# Patient Record
Sex: Female | Born: 1975 | Race: Asian | Hispanic: No | Marital: Married | Smoking: Never smoker
Health system: Southern US, Community
[De-identification: ages and names within clinical notes are randomized; demographics above are authoritative.]

## PROBLEM LIST (undated history)

## (undated) ENCOUNTER — Inpatient Hospital Stay (HOSPITAL_COMMUNITY): Payer: Self-pay

## (undated) ENCOUNTER — Emergency Department (HOSPITAL_COMMUNITY): Discharge status: Absent without leave | Payer: Medicaid Other

## (undated) DIAGNOSIS — K8020 Calculus of gallbladder without cholecystitis without obstruction: Secondary | ICD-10-CM

---

## 2008-02-02 ENCOUNTER — Emergency Department (HOSPITAL_COMMUNITY): Admission: EM | Admit: 2008-02-02 | Discharge: 2008-02-02 | Payer: Self-pay | Admitting: Family Medicine

## 2008-04-27 ENCOUNTER — Encounter: Payer: Self-pay | Admitting: Family

## 2008-04-27 ENCOUNTER — Ambulatory Visit: Payer: Self-pay | Admitting: Obstetrics & Gynecology

## 2008-04-27 LAB — CONVERTED CEMR LAB
Antibody Screen: NEGATIVE
Basophils Relative: 0 % (ref 0–1)
Eosinophils Absolute: 0.2 10*3/uL (ref 0.0–0.7)
Hepatitis B Surface Ag: NEGATIVE
Lymphs Abs: 1.8 10*3/uL (ref 0.7–4.0)
MCHC: 33.2 g/dL (ref 30.0–36.0)
Monocytes Relative: 8 % (ref 3–12)
Neutro Abs: 10.1 10*3/uL — ABNORMAL HIGH (ref 1.7–7.7)
Neutrophils Relative %: 77 % (ref 43–77)
Platelets: 286 10*3/uL (ref 150–400)
RBC: 3.36 M/uL — ABNORMAL LOW (ref 3.87–5.11)
Rubella: 57.5 intl units/mL — ABNORMAL HIGH
WBC: 13.2 10*3/uL — ABNORMAL HIGH (ref 4.0–10.5)

## 2008-04-29 ENCOUNTER — Ambulatory Visit (HOSPITAL_COMMUNITY): Admission: RE | Admit: 2008-04-29 | Discharge: 2008-04-29 | Payer: Self-pay | Admitting: Obstetrics and Gynecology

## 2008-05-10 ENCOUNTER — Encounter: Admission: RE | Admit: 2008-05-10 | Discharge: 2008-05-10 | Payer: Self-pay | Admitting: *Deleted

## 2008-05-11 ENCOUNTER — Ambulatory Visit: Payer: Self-pay | Admitting: Obstetrics & Gynecology

## 2008-05-25 ENCOUNTER — Encounter: Payer: Self-pay | Admitting: Family

## 2008-05-25 ENCOUNTER — Ambulatory Visit: Payer: Self-pay | Admitting: Obstetrics & Gynecology

## 2008-05-25 LAB — CONVERTED CEMR LAB
Chlamydia, DNA Probe: NEGATIVE
Clue Cells Wet Prep HPF POC: NONE SEEN
GC Probe Amp, Genital: NEGATIVE
Trich, Wet Prep: NONE SEEN

## 2008-05-26 ENCOUNTER — Ambulatory Visit: Payer: Self-pay | Admitting: Family Medicine

## 2008-05-26 ENCOUNTER — Encounter: Payer: Self-pay | Admitting: Family

## 2008-05-26 LAB — CONVERTED CEMR LAB: Protein, Ur: 149 mg/24hr — ABNORMAL HIGH (ref 50–100)

## 2008-06-01 ENCOUNTER — Ambulatory Visit: Payer: Self-pay | Admitting: Obstetrics & Gynecology

## 2008-06-08 ENCOUNTER — Encounter: Payer: Self-pay | Admitting: Family

## 2008-06-08 ENCOUNTER — Ambulatory Visit: Payer: Self-pay | Admitting: Obstetrics & Gynecology

## 2008-06-15 ENCOUNTER — Ambulatory Visit: Payer: Self-pay | Admitting: Obstetrics & Gynecology

## 2008-06-22 ENCOUNTER — Encounter: Payer: Self-pay | Admitting: Family

## 2008-06-22 ENCOUNTER — Ambulatory Visit: Payer: Self-pay | Admitting: Obstetrics & Gynecology

## 2008-06-28 ENCOUNTER — Inpatient Hospital Stay (HOSPITAL_COMMUNITY): Admission: AD | Admit: 2008-06-28 | Discharge: 2008-07-01 | Payer: Self-pay | Admitting: Obstetrics & Gynecology

## 2008-06-28 ENCOUNTER — Ambulatory Visit: Payer: Self-pay | Admitting: Obstetrics and Gynecology

## 2008-07-07 ENCOUNTER — Ambulatory Visit: Admission: RE | Admit: 2008-07-07 | Discharge: 2008-07-07 | Payer: Self-pay | Admitting: Obstetrics and Gynecology

## 2010-06-27 LAB — RPR: RPR Ser Ql: NONREACTIVE

## 2010-06-27 LAB — CBC
HCT: 27.1 % — ABNORMAL LOW (ref 36.0–46.0)
Hemoglobin: 6.6 g/dL — CL (ref 12.0–15.0)
MCHC: 32.9 g/dL (ref 30.0–36.0)
Platelets: 392 10*3/uL (ref 150–400)
RBC: 2.65 MIL/uL — ABNORMAL LOW (ref 3.87–5.11)
RDW: 15 % (ref 11.5–15.5)
WBC: 15 10*3/uL — ABNORMAL HIGH (ref 4.0–10.5)
WBC: 20.2 10*3/uL — ABNORMAL HIGH (ref 4.0–10.5)

## 2010-06-27 LAB — POCT URINALYSIS DIP (DEVICE)
Ketones, ur: NEGATIVE mg/dL
Nitrite: NEGATIVE
Protein, ur: NEGATIVE mg/dL
Urobilinogen, UA: 0.2 mg/dL (ref 0.0–1.0)
pH: 7 (ref 5.0–8.0)

## 2010-06-28 LAB — POCT URINALYSIS DIP (DEVICE)
Bilirubin Urine: NEGATIVE
Bilirubin Urine: NEGATIVE
Glucose, UA: NEGATIVE mg/dL
Glucose, UA: NEGATIVE mg/dL
Glucose, UA: NEGATIVE mg/dL
Ketones, ur: NEGATIVE mg/dL
Ketones, ur: NEGATIVE mg/dL
Nitrite: NEGATIVE
Protein, ur: NEGATIVE mg/dL
Specific Gravity, Urine: 1.01 (ref 1.005–1.030)
Specific Gravity, Urine: 1.015 (ref 1.005–1.030)
Specific Gravity, Urine: 1.025 (ref 1.005–1.030)
pH: 7 (ref 5.0–8.0)
pH: 7 (ref 5.0–8.0)
pH: 6.5 (ref 5.0–8.0)

## 2010-07-03 LAB — POCT URINALYSIS DIP (DEVICE)
Ketones, ur: NEGATIVE mg/dL
Ketones, ur: 15 mg/dL — AB
Protein, ur: 100 mg/dL — AB
Protein, ur: 100 mg/dL — AB
Specific Gravity, Urine: 1.015 (ref 1.005–1.030)
pH: 6.5 (ref 5.0–8.0)
pH: 7 (ref 5.0–8.0)

## 2010-07-31 NOTE — Op Note (Signed)
Diana Velasquez, Diana Velasquez                   ACCOUNT NO.:  [id number]   MEDICAL RECORD NO.:  [id number]          PATIENT TYPE:  INP   LOCATION:  9143                          FACILITY:  [facility]   PHYSICIAN:  Tilda Burrow, M.D. DATE OF BIRTH:  09-12-1975   DATE OF PROCEDURE:  06/29/2008  DATE OF DISCHARGE:                               OPERATIVE REPORT   Ms. Tappan progressed slowly with her labor.  She was 9-cm dilated at  10:05 and she was 9-10 cm, 100% effaced, -1 at 11:35 a.m. and reached  complete at 1315.  She had a good fetal descent at second stage, but did  not cooperate with contractions.  She refused to push.  We allowed the  baby to labor down.  At 4 o'clock, she was still refusing to push, even  though the epidural had been elapsed for a period of time, then taken  off.  The epidural was restarted at 1600.  Vacuum assistance was  attempted at a +3 station by Dr. Alvester Morin after consent in Language Line and  a support person.  The patient did not assist with the kiwi and 3 pulls  were attempted, but no significant progress made other than some  crowning of vertex.  The vacuum was discontinued.  At this time we  allowed her to continue to rest.  The patient had excellent analgesic at  point in time.  Shortly after 5:30 p.m. the epidural was stopped once  again.  The patient was not feeling her contractions well.  The epidural  was allowed to wear off.  At approximately 1810, the patient through her  support person stated that she felt like she wanted to push.  On her  first pushing effort she expelled the vertex completely and I arrived  immediately as the vertex was expelled.  The nurse and I together guided  the baby in a controlled fashion, delivering his shoulders, from the  left shoulder anterior position.  Delivering the baby without  difficulty.  The cord was clamped, the infant was placed briefly on  maternal abdomen.  The baby then cried spontaneously and vigorously.  Meconium was  suctioned from the nose and pharynx.  No efforts at  intubation were indicated.  The baby was a healthy female infant.  Apgars were 9 and 9.  Placenta delivered intact.  She also had a 3-  vessel cord with cord blood samples obtained.  Only first 3 lacerations  were noted, not requiring repair.  The epidural catheter will not be  removed.  The patient does not have any plans for future contraception  that we can determine at this time.      Tilda Burrow, M.D.  Electronically Signed     JVF/MEDQ  D:  06/29/2008  T:  06/30/2008  Job:  [id number]

## 2010-12-18 LAB — POCT URINALYSIS DIP (DEVICE)
Glucose, UA: NEGATIVE mg/dL
Specific Gravity, Urine: 1.01 (ref 1.005–1.030)
Urobilinogen, UA: 0.2 mg/dL (ref 0.0–1.0)
pH: 6.5 (ref 5.0–8.0)

## 2010-12-18 LAB — GC/CHLAMYDIA PROBE AMP, GENITAL
Chlamydia, DNA Probe: NEGATIVE
GC Probe Amp, Genital: NEGATIVE

## 2010-12-18 LAB — WET PREP, GENITAL

## 2010-12-18 LAB — URINE CULTURE

## 2011-11-11 ENCOUNTER — Inpatient Hospital Stay (HOSPITAL_COMMUNITY)
Admission: AD | Admit: 2011-11-11 | Discharge: 2011-11-12 | Disposition: A | Discharge status: Hospice | Payer: Medicaid Other | Source: Ambulatory Visit | Attending: Obstetrics & Gynecology | Admitting: Obstetrics & Gynecology

## 2011-11-11 ENCOUNTER — Encounter (HOSPITAL_COMMUNITY): Payer: Self-pay | Admitting: *Deleted

## 2011-11-11 DIAGNOSIS — O99891 Other specified diseases and conditions complicating pregnancy: Secondary | ICD-10-CM | POA: Insufficient documentation

## 2011-11-11 DIAGNOSIS — K219 Gastro-esophageal reflux disease without esophagitis: Secondary | ICD-10-CM | POA: Insufficient documentation

## 2011-11-11 DIAGNOSIS — R109 Unspecified abdominal pain: Secondary | ICD-10-CM | POA: Insufficient documentation

## 2011-11-11 MED ORDER — GI COCKTAIL ~~LOC~~
30.0000 mL | Freq: Once | ORAL | Status: AC
  Administered 2011-11-12: 30 mL via ORAL
  Filled 2011-11-11: qty 30

## 2011-11-11 NOTE — MAU Note (Signed)
Arrived via EMS with c/o upper abdominal pain in epigastric area approx 2000.  Says it worsened 2 hrs after she ate.  Denies bleeding or leaking

## 2011-11-12 DIAGNOSIS — K219 Gastro-esophageal reflux disease without esophagitis: Secondary | ICD-10-CM

## 2011-11-12 MED ORDER — ONDANSETRON 4 MG PO TBDP
4.0000 mg | ORAL_TABLET | Freq: Once | ORAL | Status: AC
  Administered 2011-11-12: 4 mg via ORAL
  Filled 2011-11-12: qty 1

## 2011-11-12 NOTE — MAU Provider Note (Signed)
Diana Velasquez is a 36 y.o. female presenting for eval of upper abd pain via EMS. Denies ctx, leak or bldg. Receives prenatal care at [facility] but no records avail for review. Pt's son translated. Reports this is the first instance of this pain and denies any hx of cholestasis. History OB History    Grav Para Term Preterm Abortions TAB SAB Ect Mult Living   5 3 3       3      Past Medical History  Diagnosis Date  . No pertinent past medical history    Past Surgical History  Procedure Date  . No past surgeries    Family History: family history is not on file. Social History:  reports that she has never smoked. Her smokeless tobacco use includes Chew. Her alcohol and drug histories not on file.    ROS  Dilation:  (ext os 1cm int os closed) Effacement (%): Thick Exam by:: Elie Confer RN Blood pressure 106/66, pulse 79, temperature 97.2 F (36.2 C), temperature source Oral, resp. rate 18, height 5' (1.524 m), weight 73.936 kg (163 lb), last menstrual period 04/07/2011, SpO2 100.00%. Maternal Exam:  Uterine Assessment: No ctx per toco     Fetal Exam Fetal Monitor Review: Baseline rate: 145.  Variability: moderate (6-25 bpm).   Pattern: accelerations present and no decelerations.       Physical Exam  Constitutional: She is oriented to person, place, and time. She appears well-developed and well-nourished.  HENT:  Head: Normocephalic.  Cardiovascular: Normal rate.   Respiratory: Effort normal.  GI:       abd gravid; NT  Musculoskeletal: Normal range of motion.  Neurological: She is alert and oriented to person, place, and time.  Skin: Skin is warm and dry.  Psychiatric: She has a normal mood and affect. Her behavior is normal. Thought content normal.    Prenatal labs: ABO, Rh:   Antibody:   Rubella:   RPR:    HBsAg:    HIV:    GBS:     Assessment/Plan: IUP at 35wks GERD- no hx to think this may be gallbladder  Given GI cocktail with complete relief of pain; requesting  d/c home Given Zofran ODT 4mg  at d/c due to nausea F/U at [facility] on 8/28 as scheduled   Quinnlan Abruzzo 11/12/2011, 12:25 AM

## 2011-11-20 ENCOUNTER — Other Ambulatory Visit (HOSPITAL_COMMUNITY): Payer: Self-pay | Admitting: Nurse Practitioner

## 2011-11-20 DIAGNOSIS — Z0489 Encounter for examination and observation for other specified reasons: Secondary | ICD-10-CM

## 2011-11-25 ENCOUNTER — Ambulatory Visit (HOSPITAL_COMMUNITY)
Admission: RE | Admit: 2011-11-25 | Discharge: 2011-11-25 | Disposition: A | Discharge status: Home / Self Care | Payer: Medicaid Other | Source: Ambulatory Visit | Attending: Nurse Practitioner | Admitting: Nurse Practitioner

## 2011-11-25 DIAGNOSIS — Z1389 Encounter for screening for other disorder: Secondary | ICD-10-CM | POA: Insufficient documentation

## 2011-11-25 DIAGNOSIS — Z363 Encounter for antenatal screening for malformations: Secondary | ICD-10-CM | POA: Insufficient documentation

## 2011-11-25 DIAGNOSIS — O358XX0 Maternal care for other (suspected) fetal abnormality and damage, not applicable or unspecified: Secondary | ICD-10-CM | POA: Insufficient documentation

## 2011-11-25 DIAGNOSIS — O09529 Supervision of elderly multigravida, unspecified trimester: Secondary | ICD-10-CM | POA: Insufficient documentation

## 2011-11-25 DIAGNOSIS — Z0489 Encounter for examination and observation for other specified reasons: Secondary | ICD-10-CM

## 2012-04-07 ENCOUNTER — Encounter (HOSPITAL_COMMUNITY): Payer: Self-pay | Admitting: Adult Health

## 2012-04-07 ENCOUNTER — Emergency Department (HOSPITAL_COMMUNITY): Payer: Medicaid Other

## 2012-04-07 ENCOUNTER — Emergency Department (HOSPITAL_COMMUNITY)
Admission: EM | Admit: 2012-04-07 | Discharge: 2012-04-07 | Disposition: A | Discharge status: Home / Self Care | Payer: Medicaid Other | Attending: Emergency Medicine | Admitting: Emergency Medicine

## 2012-04-07 DIAGNOSIS — R1110 Vomiting, unspecified: Secondary | ICD-10-CM | POA: Insufficient documentation

## 2012-04-07 DIAGNOSIS — Z79899 Other long term (current) drug therapy: Secondary | ICD-10-CM | POA: Insufficient documentation

## 2012-04-07 DIAGNOSIS — K219 Gastro-esophageal reflux disease without esophagitis: Secondary | ICD-10-CM | POA: Insufficient documentation

## 2012-04-07 LAB — CBC
HCT: 37.9 % (ref 36.0–46.0)
Hemoglobin: 12.4 g/dL (ref 12.0–15.0)
MCHC: 32.7 g/dL (ref 30.0–36.0)
MCV: 86.5 fL (ref 78.0–100.0)
RDW: 14.1 % (ref 11.5–15.5)

## 2012-04-07 LAB — BASIC METABOLIC PANEL
BUN: 9 mg/dL (ref 6–23)
Chloride: 96 mEq/L (ref 96–112)
Creatinine, Ser: 0.54 mg/dL (ref 0.50–1.10)
GFR calc Af Amer: >90 mL/min (ref >90)
GFR calc non Af Amer: >90 mL/min (ref >90)
Glucose, Bld: 123 mg/dL — ABNORMAL HIGH (ref 70–99)
Potassium: 3.2 mEq/L — ABNORMAL LOW (ref 3.5–5.1)

## 2012-04-07 LAB — POCT I-STAT TROPONIN I

## 2012-04-07 MED ORDER — ONDANSETRON 4 MG PO TBDP
8.0000 mg | ORAL_TABLET | Freq: Once | ORAL | Status: AC
  Administered 2012-04-07: 8 mg via ORAL
  Filled 2012-04-07: qty 2

## 2012-04-07 MED ORDER — GI COCKTAIL ~~LOC~~
30.0000 mL | Freq: Once | ORAL | Status: AC
  Administered 2012-04-07: 30 mL via ORAL
  Filled 2012-04-07: qty 30

## 2012-04-07 MED ORDER — OMEPRAZOLE 20 MG PO CPDR: 20.0000 mg | DELAYED_RELEASE_CAPSULE | Freq: Every day | ORAL | Status: DC

## 2012-04-07 MED ORDER — PROMETHAZINE HCL 25 MG PO TABS: 25.0000 mg | ORAL_TABLET | Freq: Four times a day (QID) | ORAL | Status: DC | PRN

## 2012-04-07 NOTE — ED Notes (Addendum)
Pt states that she does not speak english; pt speaks burmese

## 2012-04-07 NOTE — ED Provider Notes (Signed)
Medical screening examination/treatment/procedure(s) were performed by non-physician practitioner and as supervising physician I was immediately available for consultation/collaboration.   Charles B. Bernette Mayers, MD 04/07/12 2125

## 2012-04-07 NOTE — ED Notes (Signed)
Used interpretor phone to teach pt about d/c instructions and prescriptions. Pt denies pain and verbalizes understanding of d/c teaching. Pt states she was able to keep the water down that she drank without throwing up. Pt states she understands her instructions and has no further questions upon d/c teaching. Pt does not appear to be in acute distress upon d/c.

## 2012-04-07 NOTE — ED Notes (Signed)
Used translator phone to assess pt

## 2012-04-07 NOTE — ED Notes (Signed)
Presents with epigastric pain that radiates into abdomen across both upper quadrants. Pt has vomited X2 today. She denies dizziness, denies SOB. Reports pain is worse with inspiration. She has been taking prevacid and experienced this once before when she was pregnant was given zantac which helped. The pain is worse when sitting up and better when laying on side.

## 2012-04-07 NOTE — ED Notes (Signed)
Kyung Bacca, PA at bedside with pt and interpretor phone

## 2012-04-07 NOTE — ED Provider Notes (Signed)
History     CSN: [id number]  Arrival date & time 04/07/12  1557   None     Chief Complaint  Patient presents with  . Chest Pain    (Consider location/radiation/quality/duration/timing/severity/associated sxs/prior treatment) HPI History provided by pt through phone interpreter.  Pt reports constant, non-radiating sub-sternal CP since 10am today.  Started while breast-feeding her 20mo baby.  No aggravating/alleviating factors.  Associated w/ 2 episodes of vomiting.  Denies fever, cough, SOB, diarrhea.  Has had similar pain in the past, was diagnosed with acid reflux and is taking prevacid. No relief w/ this medication today.  Her pain has gradually subsided and is minimal currently.  No RF for PE and denies LE pain/edema.  No RF for ACS.  Denies trauma.  Past Medical History  Diagnosis Date  . No pertinent past medical history     Past Surgical History  Procedure Date  . No past surgeries     History reviewed. No pertinent family history.  History  Substance Use Topics  . Smoking status: Never Smoker   . Smokeless tobacco: Current User    Types: Chew  . Alcohol Use:     OB History    Grav Para Term Preterm Abortions TAB SAB Ect Mult Living   5 3 3       3       Review of Systems  All other systems reviewed and are negative.    Allergies  Review of patient's allergies indicates no known allergies.  Home Medications   Current Outpatient Rx  Name  Route  Sig  Dispense  Refill  . LANSOPRAZOLE 15 MG PO CPDR   Oral   Take 15 mg by mouth daily.           BP 107/65  Pulse 70  Temp 98 F (36.7 C) (Oral)  Resp 22  SpO2 100%  Breastfeeding? Unknown  Physical Exam  Nursing note and vitals reviewed. Constitutional: She is oriented to person, place, and time. She appears well-developed and well-nourished. No distress.  HENT:  Head: Normocephalic and atraumatic.  Eyes:       Normal appearance  Neck: Normal range of motion.  Cardiovascular: Normal rate,  regular rhythm and intact distal pulses.   Pulmonary/Chest: Effort normal and breath sounds normal. No respiratory distress. She exhibits no tenderness.       Right breast w/out erythema, induration or nipple discharge.  Abdominal: Soft. Bowel sounds are normal. She exhibits no distension. There is no tenderness. There is no guarding.  Musculoskeletal: Normal range of motion.       No peripheral edema or calf tenderness  Neurological: She is alert and oriented to person, place, and time.  Skin: Skin is warm and dry. No rash noted.  Psychiatric: She has a normal mood and affect. Her behavior is normal.    ED Course  Procedures (including critical care time)   Date: 04/07/2012  Rate: 73  Rhythm: normal sinus rhythm  QRS Axis: normal  Intervals: normal  ST/T Wave abnormalities: normal  Conduction Disutrbances:none  Narrative Interpretation:   Old EKG Reviewed: none available   Labs Reviewed  CBC - Abnormal; Notable for the following:    WBC 16.1 (*)     All other components within normal limits  BASIC METABOLIC PANEL - Abnormal; Notable for the following:    Sodium 134 (*)     Potassium 3.2 (*)     Glucose, Bld 123 (*)     All other components  within normal limits  LIPASE, BLOOD  POCT I-STAT TROPONIN I   Dg Chest 2 View  04/07/2012  *RADIOLOGY REPORT*  Clinical Data: 37 year old female with chest pain.  CHEST - 2 VIEW  Comparison: 05/10/2008 and  Findings: The cardiomediastinal silhouette is unremarkable. Mild peribronchial thickening again noted. There is no evidence of focal airspace disease, pulmonary edema, suspicious pulmonary nodule/mass, pleural effusion, or pneumothorax. No acute bony abnormalities are identified.  IMPRESSION: No evidence of acute cardiopulmonary disease.  Mild chronic peribronchial thickening.   Original Report Authenticated By: Harmon Pier, M.D.      1. Acid reflux       MDM  36yo healthy F presents w/ CP.  H/o acid reflux, for which she takes  daily prevacid, and this pain feels similar.  No RF for or exam findings concerning for PE.  Low risk ACS.  Labs unremarkable w/ exception of mild leukocytosis and CXR neg for acute pathology.  Results discussed w/ pt through interpreter.  Will treat for acid reflux.  She received a GI cocktail and ODT zofran in ED and d/c'd home w/ prilosec and promethazine.  At time of discharge, she reported feeling like there was something "coming up" or "skin coming off" or blockage in her throat when she vomits.  There is no palpable mass nor stridor on exam and nursing staff has watched her swallow w/out difficulty.  Sensation likely associated w/ reflux.  Referred to [facility].  Return precautions discussed. 8:56 PM         Otilio Miu, PA-C 04/07/12 1610  Otilio Miu, PA-C 04/07/12 2106

## 2012-04-27 ENCOUNTER — Emergency Department (HOSPITAL_COMMUNITY): Payer: Medicaid Other

## 2012-04-27 ENCOUNTER — Encounter (HOSPITAL_COMMUNITY): Payer: Self-pay | Admitting: *Deleted

## 2012-04-27 ENCOUNTER — Inpatient Hospital Stay (HOSPITAL_COMMUNITY)
Admission: EM | Admit: 2012-04-27 | Discharge: 2012-04-29 | DRG: 419 | Disposition: A | Discharge status: Home / Self Care | Payer: Medicaid Other | Attending: Surgery | Admitting: Surgery

## 2012-04-27 DIAGNOSIS — N289 Disorder of kidney and ureter, unspecified: Secondary | ICD-10-CM | POA: Diagnosis present

## 2012-04-27 DIAGNOSIS — K8000 Calculus of gallbladder with acute cholecystitis without obstruction: Principal | ICD-10-CM | POA: Diagnosis present

## 2012-04-27 DIAGNOSIS — K819 Cholecystitis, unspecified: Secondary | ICD-10-CM

## 2012-04-27 DIAGNOSIS — K219 Gastro-esophageal reflux disease without esophagitis: Secondary | ICD-10-CM | POA: Diagnosis present

## 2012-04-27 DIAGNOSIS — K8010 Calculus of gallbladder with chronic cholecystitis without obstruction: Secondary | ICD-10-CM

## 2012-04-27 DIAGNOSIS — E079 Disorder of thyroid, unspecified: Secondary | ICD-10-CM | POA: Diagnosis present

## 2012-04-27 DIAGNOSIS — Z79899 Other long term (current) drug therapy: Secondary | ICD-10-CM

## 2012-04-27 LAB — URINALYSIS, ROUTINE W REFLEX MICROSCOPIC
Bilirubin Urine: NEGATIVE
Glucose, UA: NEGATIVE mg/dL
Hgb urine dipstick: NEGATIVE
Ketones, ur: 15 mg/dL — AB
Leukocytes, UA: NEGATIVE
Nitrite: NEGATIVE
Protein, ur: NEGATIVE mg/dL
Specific Gravity, Urine: 1.007 (ref 1.005–1.030)
Urobilinogen, UA: 0.2 mg/dL (ref 0.0–1.0)
pH: 5.5 (ref 5.0–8.0)

## 2012-04-27 LAB — COMPREHENSIVE METABOLIC PANEL
Albumin: 3.9 g/dL (ref 3.5–5.2)
Alkaline Phosphatase: 102 U/L (ref 39–117)
BUN: 9 mg/dL (ref 6–23)
Chloride: 100 mEq/L (ref 96–112)
Creatinine, Ser: 0.58 mg/dL (ref 0.50–1.10)
GFR calc Af Amer: >90 mL/min (ref >90)
GFR calc non Af Amer: >90 mL/min (ref >90)
Glucose, Bld: 127 mg/dL — ABNORMAL HIGH (ref 70–99)
Potassium: 2.9 mEq/L — ABNORMAL LOW (ref 3.5–5.1)
Total Bilirubin: 0.2 mg/dL — ABNORMAL LOW (ref 0.3–1.2)

## 2012-04-27 LAB — CBC
HCT: 37 % (ref 36.0–46.0)
Hemoglobin: 12.5 g/dL (ref 12.0–15.0)
MCV: 87.1 fL (ref 78.0–100.0)
RDW: 13.4 % (ref 11.5–15.5)
WBC: 11.1 10*3/uL — ABNORMAL HIGH (ref 4.0–10.5)

## 2012-04-27 LAB — LIPASE, BLOOD: Lipase: 51 U/L (ref 11–59)

## 2012-04-27 MED ORDER — HYDROMORPHONE HCL PF 1 MG/ML IJ SOLN
1.0000 mg | Freq: Once | INTRAMUSCULAR | Status: AC
  Administered 2012-04-27: 1 mg via INTRAVENOUS
  Filled 2012-04-27: qty 1

## 2012-04-27 MED ORDER — PANTOPRAZOLE SODIUM 40 MG PO TBEC
40.0000 mg | DELAYED_RELEASE_TABLET | Freq: Every day | ORAL | Status: DC
  Administered 2012-04-27 – 2012-04-29 (×3): 40 mg via ORAL
  Filled 2012-04-27 (×4): qty 1

## 2012-04-27 MED ORDER — ACETAMINOPHEN 325 MG PO TABS: 650.0000 mg | ORAL_TABLET | Freq: Four times a day (QID) | ORAL | Status: DC | PRN

## 2012-04-27 MED ORDER — SODIUM CHLORIDE 0.9 % IV BOLUS (SEPSIS)
1000.0000 mL | Freq: Once | INTRAVENOUS | Status: AC
  Administered 2012-04-27: 1000 mL via INTRAVENOUS

## 2012-04-27 MED ORDER — ACETAMINOPHEN 650 MG RE SUPP: 650.0000 mg | Freq: Four times a day (QID) | RECTAL | Status: DC | PRN

## 2012-04-27 MED ORDER — POTASSIUM CHLORIDE 20 MEQ PO PACK
20.0000 meq | PACK | Freq: Two times a day (BID) | ORAL | Status: DC
  Filled 2012-04-27: qty 1

## 2012-04-27 MED ORDER — DOCUSATE SODIUM 100 MG PO CAPS: 100.0000 mg | ORAL_CAPSULE | Freq: Two times a day (BID) | ORAL | Status: DC | PRN

## 2012-04-27 MED ORDER — POTASSIUM CHLORIDE IN NACL 20-0.9 MEQ/L-% IV SOLN
INTRAVENOUS | Status: DC
  Administered 2012-04-27 – 2012-04-29 (×3): via INTRAVENOUS
  Filled 2012-04-27 (×8): qty 1000

## 2012-04-27 MED ORDER — ONDANSETRON HCL 4 MG/2ML IJ SOLN
4.0000 mg | Freq: Four times a day (QID) | INTRAMUSCULAR | Status: DC | PRN
  Administered 2012-04-29: 4 mg via INTRAVENOUS
  Filled 2012-04-27: qty 2

## 2012-04-27 MED ORDER — PIPERACILLIN-TAZOBACTAM 3.375 G IVPB
3.3750 g | Freq: Once | INTRAVENOUS | Status: AC
  Administered 2012-04-27: 3.375 g via INTRAVENOUS
  Filled 2012-04-27: qty 50

## 2012-04-27 MED ORDER — MORPHINE SULFATE 4 MG/ML IJ SOLN
4.0000 mg | Freq: Once | INTRAMUSCULAR | Status: AC
  Administered 2012-04-27: 4 mg via INTRAVENOUS
  Filled 2012-04-27: qty 1

## 2012-04-27 MED ORDER — POTASSIUM CHLORIDE CRYS ER 20 MEQ PO TBCR
20.0000 meq | EXTENDED_RELEASE_TABLET | Freq: Two times a day (BID) | ORAL | Status: DC
  Administered 2012-04-27 – 2012-04-29 (×4): 20 meq via ORAL
  Filled 2012-04-27 (×6): qty 1

## 2012-04-27 MED ORDER — DIPHENHYDRAMINE HCL 50 MG/ML IJ SOLN
12.5000 mg | Freq: Four times a day (QID) | INTRAMUSCULAR | Status: DC | PRN
  Administered 2012-04-28: 12.5 mg via INTRAVENOUS
  Filled 2012-04-27: qty 1

## 2012-04-27 MED ORDER — ONDANSETRON HCL 4 MG/2ML IJ SOLN
4.0000 mg | Freq: Once | INTRAMUSCULAR | Status: AC
  Administered 2012-04-27: 4 mg via INTRAVENOUS
  Filled 2012-04-27: qty 2

## 2012-04-27 MED ORDER — DIPHENHYDRAMINE HCL 12.5 MG/5ML PO ELIX
12.5000 mg | ORAL_SOLUTION | Freq: Four times a day (QID) | ORAL | Status: DC | PRN
  Filled 2012-04-27: qty 10

## 2012-04-27 MED ORDER — IOHEXOL 300 MG/ML  SOLN
20.0000 mL | INTRAMUSCULAR | Status: AC
  Administered 2012-04-27: 25 mL via ORAL

## 2012-04-27 MED ORDER — POTASSIUM CHLORIDE 10 MEQ/100ML IV SOLN
10.0000 meq | Freq: Once | INTRAVENOUS | Status: AC
  Administered 2012-04-27: 10 meq via INTRAVENOUS
  Filled 2012-04-27: qty 100

## 2012-04-27 MED ORDER — MORPHINE SULFATE 2 MG/ML IJ SOLN
1.0000 mg | INTRAMUSCULAR | Status: DC | PRN
  Administered 2012-04-27: 2 mg via INTRAVENOUS
  Filled 2012-04-27: qty 1

## 2012-04-27 MED ORDER — IOHEXOL 350 MG/ML SOLN
100.0000 mL | Freq: Once | INTRAVENOUS | Status: AC | PRN
  Administered 2012-04-27: 100 mL via INTRAVENOUS

## 2012-04-27 NOTE — ED Notes (Signed)
Radiology in room with pt

## 2012-04-27 NOTE — ED Notes (Signed)
Pt c/o of more pain to the llq and throughout abd. Orders given for more pain medication.

## 2012-04-27 NOTE — ED Notes (Signed)
Per son, pt awoke with abd pain at 0300 today that is radiating into her chest and radiating into her back

## 2012-04-27 NOTE — H&P (Signed)
Diana Velasquez 06-May-1975  [id number].    Requesting MD: Charlestine Night, PA-C Chief Complaint/Reason for Consult:  RUQ abdominal pain HPI:  37 yo female with a history of GERD who presents today with abdominal pain. This pain has been going on since the birth of her last pregnancy in December. She has previously been at the ED two times for the same complaint. Both times her diagnosis was with reflux and the first time she was treated with zantac and the second time with omeprazole. The patient is unsure if these medications have been helping. The patient is unresponsive to many questions. The pain is localized at the epigastric area and left quadrant of the abdomen more upper than lower, but when palpating she is tender in the RUQ and epigastric area and not the left side. The pain is waxing and waning and is increased with foods that are spicy or carbonated. She ate macaroni and cheese yesterday and that increased her pain. Complains of shortness of breath and very yellow urine. Denies fever, nausea, vomiting, or changes in her bowel movement. Does not see a primary care physician.    History reviewed. No pertinent family history.  Past Medical History  Diagnosis Date  . No pertinent past medical history     Past Surgical History  Procedure Laterality Date  . No past surgeries      Social History:  reports that she has never smoked. Her smokeless tobacco use includes Chew. She reports that she does not drink alcohol or use illicit drugs.  Allergies: No Known Allergies   Blood pressure 118/80, pulse 77, temperature 98 F (36.7 C), temperature source Oral, resp. rate 23, SpO2 100.00%. Physical Exam: General: pleasant, WD/WN Burmese female who is laying in bed in NAD HEENT: head is normocephalic, atraumatic.  Sclera are noninjected.  PERRL.  Ears and nose without any masses or lesions.  Mouth is pink and moist Heart: regular, rate, and rhythm.  Normal s1,s2. No obvious murmurs, gallops,  or rubs noted.  Palpable radial and pedal pulses bilaterally Lungs: CTAB, no wheezes, rhonchi, or rales noted.  Respiratory effort nonlabored Abd: soft, ND, moderately tender in RUQ and epigastric area, no tenderness in LUQ,  +BS, no masses, hernias, or organomegaly MS: all 4 extremities are symmetrical with no cyanosis, clubbing, or edema. Skin: warm and dry with no masses, lesions, or rashes Psych: A&Ox3 with an appropriate affect.   Results for orders placed during the hospital encounter of 04/27/12 (from the past 48 hour(s))  CBC     Status: Abnormal   Collection Time    04/27/12  6:32 AM      Result Value Range   WBC 11.1 (*) 4.0 - 10.5 K/uL   RBC 4.25  3.87 - 5.11 MIL/uL   Hemoglobin 12.5  12.0 - 15.0 g/dL   HCT 14.7  82.9 - 56.2 %   MCV 87.1  78.0 - 100.0 fL   MCH 29.4  26.0 - 34.0 pg   MCHC 33.8  30.0 - 36.0 g/dL   RDW 13.0  86.5 - 78.4 %   Platelets 335  150 - 400 K/uL  COMPREHENSIVE METABOLIC PANEL     Status: Abnormal   Collection Time    04/27/12  6:32 AM      Result Value Range   Sodium 136  135 - 145 mEq/L   Potassium 2.9 (*) 3.5 - 5.1 mEq/L   Chloride 100  96 - 112 mEq/L   CO2 23  19 -  32 mEq/L   Glucose, Bld 127 (*) 70 - 99 mg/dL   BUN 9  6 - 23 mg/dL   Creatinine, Ser 1.61  0.50 - 1.10 mg/dL   Calcium 9.1  8.4 - 09.6 mg/dL   Total Protein 8.2  6.0 - 8.3 g/dL   Albumin 3.9  3.5 - 5.2 g/dL   AST 26  0 - 37 U/L   ALT 23  0 - 35 U/L   Alkaline Phosphatase 102  39 - 117 U/L   Total Bilirubin 0.2 (*) 0.3 - 1.2 mg/dL   GFR calc non Af Amer >90  >90 mL/min   GFR calc Af Amer >90  >90 mL/min   Comment:            The eGFR has been calculated     using the CKD EPI equation.     This calculation has not been     validated in all clinical     situations.     eGFR's persistently     <90 mL/min signify     possible Chronic Kidney Disease.  LIPASE, BLOOD     Status: None   Collection Time    04/27/12  6:32 AM      Result Value Range   Lipase 51  11 - 59 U/L   URINALYSIS, ROUTINE W REFLEX MICROSCOPIC     Status: Abnormal   Collection Time    04/27/12  9:28 AM      Result Value Range   Color, Urine YELLOW  YELLOW   APPearance CLEAR  CLEAR   Specific Gravity, Urine 1.007  1.005 - 1.030   pH 5.5  5.0 - 8.0   Glucose, UA NEGATIVE  NEGATIVE mg/dL   Hgb urine dipstick NEGATIVE  NEGATIVE   Bilirubin Urine NEGATIVE  NEGATIVE   Ketones, ur 15 (*) NEGATIVE mg/dL   Protein, ur NEGATIVE  NEGATIVE mg/dL   Urobilinogen, UA 0.2  0.0 - 1.0 mg/dL   Nitrite NEGATIVE  NEGATIVE   Leukocytes, UA NEGATIVE  NEGATIVE   Comment: MICROSCOPIC NOT DONE ON URINES WITH NEGATIVE PROTEIN, BLOOD, LEUKOCYTES, NITRITE, OR GLUCOSE <1000 mg/dL.  POCT PREGNANCY, URINE     Status: None   Collection Time    04/27/12  9:36 AM      Result Value Range   Preg Test, Ur NEGATIVE  NEGATIVE   Comment:            THE SENSITIVITY OF THIS     METHODOLOGY IS >24 mIU/mL   Ct Angio Chest Pe W/cm &/or Wo Cm  04/27/2012  *RADIOLOGY REPORT*  Clinical Data:  Abdominal and chest pain, chest tightness, postpartum October 2013  CT ANGIOGRAPHY CHEST CT ABDOMEN AND PELVIS WITH CONTRAST  Technique:  Multidetector CT imaging of the chest was performed using the standard protocol during bolus administration of intravenous contrast.  Multiplanar CT image reconstructions including MIPs were obtained to evaluate the vascular anatomy. Multidetector CT imaging of the abdomen and pelvis was performed using the standard protocol during bolus administration of intravenous contrast.  Contrast: OMNIPAQUE IOHEXOL 350 MG/ML SOLN Dilute oral contrast.  Comparison:  None  CTA CHEST  Findings: Marked enlargement of left thyroid lobe question mass versus multinodular goiter, extending above the cranial limits of exam, measuring at least 5.5 x 4.8 x 3.7 cm in size and containing large irregular dense central calcifications. Displacement of trachea left to right. No thoracic adenopathy. Aorta normal caliber without  aneurysm or dissection. Pulmonary arteries  patent. No evidence of pulmonary embolism. Minimal dependent atelectasis at lung bases without infiltrate or pleural effusion. No acute osseous findings.  Review of the MIP images confirms the above findings.  IMPRESSION: No evidence of pulmonary embolism. Marked left thyroid enlargement question mass versus multiple nodules, containing central calcification; follow-up non emergent thyroid sonography recommended for further evaluation to exclude thyroid mass.  CT ABDOMEN AND PELVIS  Findings: Indeterminate attenuation lesion right kidney 10 x 11 mm image 33. Liver, spleen, pancreas, kidneys, and adrenal glands otherwise normal appearance. Distended gallbladder with thickened wall, pericholecystic fluid and dependent gallstones question acute cholecystitis. CBD mildly prominent 8 mm diameter.  Normal appendix.  Unremarkable bladder, ureters, and adnexae. Central low attenuation within uterus question related to phase of menses. Stomach and bowel loops normal appearance. No mass, adenopathy or free air or acute osseous findings.  IMPRESSION: Distended gallbladder with thickened wall, pericholecystic fluid and dependent calculus, most consistent with acute cholecystitis. Dilated CBD 8 mm diameter, recommend correlation with LFTs to exclude biliary obstruction. Indeterminate right renal nodule 11 x 10 mm.   Original Report Authenticated By: Ulyses Southward, M.D.    Ct Abdomen Pelvis W Contrast  04/27/2012  *RADIOLOGY REPORT*  Clinical Data:  Abdominal and chest pain, chest tightness, postpartum October 2013  CT ANGIOGRAPHY CHEST CT ABDOMEN AND PELVIS WITH CONTRAST  Technique:  Multidetector CT imaging of the chest was performed using the standard protocol during bolus administration of intravenous contrast.  Multiplanar CT image reconstructions including MIPs were obtained to evaluate the vascular anatomy. Multidetector CT imaging of the abdomen and pelvis was performed using the  standard protocol during bolus administration of intravenous contrast.  Contrast: OMNIPAQUE IOHEXOL 350 MG/ML SOLN Dilute oral contrast.  Comparison:  None  CTA CHEST  Findings: Marked enlargement of left thyroid lobe question mass versus multinodular goiter, extending above the cranial limits of exam, measuring at least 5.5 x 4.8 x 3.7 cm in size and containing large irregular dense central calcifications. Displacement of trachea left to right. No thoracic adenopathy. Aorta normal caliber without aneurysm or dissection. Pulmonary arteries patent. No evidence of pulmonary embolism. Minimal dependent atelectasis at lung bases without infiltrate or pleural effusion. No acute osseous findings.  Review of the MIP images confirms the above findings.  IMPRESSION: No evidence of pulmonary embolism. Marked left thyroid enlargement question mass versus multiple nodules, containing central calcification; follow-up non emergent thyroid sonography recommended for further evaluation to exclude thyroid mass.  CT ABDOMEN AND PELVIS  Findings: Indeterminate attenuation lesion right kidney 10 x 11 mm image 33. Liver, spleen, pancreas, kidneys, and adrenal glands otherwise normal appearance. Distended gallbladder with thickened wall, pericholecystic fluid and dependent gallstones question acute cholecystitis. CBD mildly prominent 8 mm diameter.  Normal appendix.  Unremarkable bladder, ureters, and adnexae. Central low attenuation within uterus question related to phase of menses. Stomach and bowel loops normal appearance. No mass, adenopathy or free air or acute osseous findings.  IMPRESSION: Distended gallbladder with thickened wall, pericholecystic fluid and dependent calculus, most consistent with acute cholecystitis. Dilated CBD 8 mm diameter, recommend correlation with LFTs to exclude biliary obstruction. Indeterminate right renal nodule 11 x 10 mm.   Original Report Authenticated By: Ulyses Southward, M.D.    Dg Chest  Portable 1 View  04/27/2012  *RADIOLOGY REPORT*  Clinical Data: Abdominal pain.  Chest pain.  PORTABLE CHEST - 1 VIEW  Comparison: 04/07/2012 and 05/10/2008  Findings: Mild central pulmonary vascular prominence unchanged.  No infiltrate, congestive heart failure or  pneumothorax.  Impression upon the left lateral aspect of the trachea which is displaced to the right.  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  This is incompletely assessed on the present exam.  Heart is mildly enlarged.  IMPRESSION: Mild central pulmonary vascular prominence unchanged.  No infiltrate, congestive heart failure or pneumothorax.  Impression upon the left lateral aspect of the trachea which is displaced to the right.  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  This is incompletely assessed on the present exam.  Heart is mildly enlarged.   Original Report Authenticated By: Lacy Duverney, M.D.        Assessment/Plan 1.  RUQ and epigastric abdominal pain with findings of acute cholecystitis on CT  1.  Admit to medsurg floor, NPO, IVF, pain control, antiemetics  2.  Pt would prefer not to have surgery today due to "not feeling prepared" and she also drank some liquids around lunch  3.  Will likely plan for OR tomorrow, will defer to Dr. Ezzard Standing for timing  2.  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  Right renal nodule - 11 x 10 mm  The patient speaks Berm[place], son who is in 9th grade is interpreting.  Aris Georgia 04/27/2012, 2:16 PM Pager: [phone]  Son, Lamar Sprinkles, is in room.  He speaks good [place].  He is in 9th grade.  We also got the translator phone. She is also breast feeding a 79 month old girl.  I recommend she throw the milk away for a day or 2 peri-operatively, but otherwise she will be okay. I also mentioned the left thyroid mass to her and her son.  I discussed with the patient the indications and risks of gall bladder surgery.   The primary risks of gall bladder surgery include, but are not limited to, bleeding, infection, common bile duct injury, and open surgery.  There is also the risk that the patient may have continued symptoms after surgery.  However, the likelihood of improvement in symptoms and return to the patient's normal status is good. We discussed the typical post-operative recovery course. I tried to answer the patient's questions. The timing of gall bladder surgery is unclear at this time.  Ovidio Kin, MD, [facility] Pager: [phone] Office phone:  [phone]

## 2012-04-27 NOTE — ED Provider Notes (Signed)
History     CSN: [id number]  Arrival date & time 04/27/12  0617   First MD Initiated Contact with Patient 04/27/12 [phone]      Chief Complaint  Patient presents with  . Abdominal Pain  . Chest Pain    (Consider location/radiation/quality/duration/timing/severity/associated sxs/prior treatment) HPI Patient is a 37 yo female with a history of GERD who presents today with abdominal pain.  This pain has been going on since the birth of her last pregnancy in December.  She has previously been at the ED two times for the same complaint.  Both times her diagnosis was with reflux and the first time she was treated with zantac and the second time with omeprazole.  The patient is unsure if these medications have been helping.  The patient is unresponsive to many questions.  The pain is localized at the epigastric area and left quadrant of the abdomen more upper than lower.  The pain is waxing and waning and is increased with foods that are spicy or carbonated.  She ate macaroni and cheese yesterday and that increased her pain. Complains of shortness of breath and very yellow urine.  Denies fever, nausea, vomiting, or changes in her bowel movement.  Does not see a primary care physician.    Past Medical History  Diagnosis Date  . No pertinent past medical history     Past Surgical History  Procedure Laterality Date  . No past surgeries      History reviewed. No pertinent family history.  History  Substance Use Topics  . Smoking status: Never Smoker   . Smokeless tobacco: Current User    Types: Chew  . Alcohol Use: No    OB History   Grav Para Term Preterm Abortions TAB SAB Ect Mult Living   5 3 3       3       Review of Systems All other systems negative except as documented in the HPI. All pertinent positives and negatives as reviewed in the HPI.  Allergies  Review of patient's allergies indicates no known allergies.  Home Medications   Current Outpatient Rx  Name  Route  Sig   Dispense  Refill  . omeprazole (PRILOSEC) 20 MG capsule   Oral   Take 1 capsule (20 mg total) by mouth daily.   14 capsule   0     BP 118/71  Pulse 73  Temp(Src) 97.7 F (36.5 C) (Oral)  Resp 22  SpO2 100%  Physical Exam  Constitutional: She is oriented to person, place, and time. She appears well-developed and well-nourished.  HENT:  Head: Normocephalic and atraumatic.  Right Ear: External ear normal.  Left Ear: External ear normal.  Mouth/Throat: Uvula is midline, oropharynx is clear and moist and mucous membranes are normal.  Eyes: Conjunctivae, EOM and lids are normal. Pupils are equal, round, and reactive to light.  Neck: Normal range of motion.  Cardiovascular: Normal rate, regular rhythm and normal heart sounds.  Exam reveals no gallop and no friction rub.   No murmur heard. Pulmonary/Chest: Effort normal and breath sounds normal. No respiratory distress. She has no decreased breath sounds. She has no wheezes. She has no rhonchi. She has no rales. She exhibits no tenderness.  Abdominal: Soft. Normal appearance and bowel sounds are normal. She exhibits no distension and no mass. There is tenderness in the epigastric area, left upper quadrant and left lower quadrant. There is no rebound and no guarding.    Musculoskeletal: Normal range  of motion. She exhibits no edema and no tenderness.  Neurological: She is alert and oriented to person, place, and time.  Skin: No rash noted. She is not diaphoretic. No erythema. No pallor.  Psychiatric: She has a normal mood and affect. Her speech is normal and behavior is normal. Judgment and thought content normal. Cognition and memory are normal.    ED Course  Procedures (including critical care time)  9:57 am After receiving pain medications the patient is feeling much better  Labs Reviewed  CBC - Abnormal; Notable for the following:    WBC 11.1 (*)    All other components within normal limits  COMPREHENSIVE METABOLIC PANEL    URINALYSIS, ROUTINE W REFLEX MICROSCOPIC   Dg Chest Portable 1 View  04/27/2012  *RADIOLOGY REPORT*  Clinical Data: Abdominal pain.  Chest pain.  PORTABLE CHEST - 1 VIEW  Comparison: 04/07/2012 and 05/10/2008  Findings: Mild central pulmonary vascular prominence unchanged.  No infiltrate, congestive heart failure or pneumothorax.  Impression upon the left lateral aspect of the trachea which is displaced to the right.  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  This is incompletely assessed on the present exam.  Heart is mildly enlarged.  IMPRESSION: Mild central pulmonary vascular prominence unchanged.  No infiltrate, congestive heart failure or pneumothorax.  Impression upon the left lateral aspect of the trachea which is displaced to the right.  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  This is incompletely assessed on the present exam.  Heart is mildly enlarged.   Original Report Authenticated By: Lacy Duverney, M.D.     The patient will be admitted by surgery for cholecystitis.  I spoke with surgery, who will come down to see the patient for admission.  Patient given Zosyn 3.375 mg..  Patient has remained stable here in the emergency room.   MDM  MDM Reviewed: nursing note, vitals and previous chart Reviewed previous: labs and ECG Interpretation: labs, ECG and CT scan Consults: general surgery     Patient had a very vague and I complaint and presentation that is why we ordered a CT of her chest and the CT of her abdomen.  The patient had been seen previously on 2 other visits and no other imaging had been done.   Date: 04/27/2012  Rate: 75  Rhythm: normal sinus rhythm  QRS Axis: normal  Intervals: normal  ST/T Wave abnormalities: normal  Conduction Disutrbances:none  Narrative Interpretation:   Old EKG Reviewed: unchanged       Carlyle Dolly, PA-C 04/27/12 1609  Carlyle Dolly,  PA-C 04/27/12 1610  Carlyle Dolly, PA-C 04/27/12 [phone]

## 2012-04-27 NOTE — Progress Notes (Signed)
Pt came up from ED today. Received report from RN in ED, pt is in NAD and will continue to monitor.

## 2012-04-27 NOTE — ED Notes (Signed)
REPORT CALLED

## 2012-04-27 NOTE — ED Notes (Signed)
CALLED OR AND SPOKE TO RHONDA. THE PATIENT HAS NOT BEEN POSTED ON THE OR SCHEDULE AS YET

## 2012-04-27 NOTE — ED Notes (Signed)
General Surgery at the bedside to assess pt. Pt reports no pain at this time.

## 2012-04-28 ENCOUNTER — Inpatient Hospital Stay (HOSPITAL_COMMUNITY): Payer: Medicaid Other | Admitting: Anesthesiology

## 2012-04-28 ENCOUNTER — Encounter (HOSPITAL_COMMUNITY): Admission: EM | Disposition: A | Discharge status: Home / Self Care | Payer: Self-pay | Source: Home / Self Care

## 2012-04-28 ENCOUNTER — Encounter (HOSPITAL_COMMUNITY): Payer: Self-pay | Admitting: Anesthesiology

## 2012-04-28 ENCOUNTER — Inpatient Hospital Stay (HOSPITAL_COMMUNITY): Payer: Medicaid Other

## 2012-04-28 HISTORY — PX: CHOLECYSTECTOMY: SHX55

## 2012-04-28 LAB — SURGICAL PCR SCREEN
MRSA, PCR: NEGATIVE
Staphylococcus aureus: NEGATIVE

## 2012-04-28 SURGERY — LAPAROSCOPIC CHOLECYSTECTOMY WITH INTRAOPERATIVE CHOLANGIOGRAM
Anesthesia: General | Wound class: Clean Contaminated

## 2012-04-28 MED ORDER — FENTANYL CITRATE 0.05 MG/ML IJ SOLN
INTRAMUSCULAR | Status: DC | PRN
  Administered 2012-04-28 (×2): 100 ug via INTRAVENOUS
  Administered 2012-04-28: 50 ug via INTRAVENOUS

## 2012-04-28 MED ORDER — LACTATED RINGERS IV SOLN
INTRAVENOUS | Status: DC | PRN
  Administered 2012-04-28: 15:00:00 via INTRAVENOUS

## 2012-04-28 MED ORDER — SUCCINYLCHOLINE CHLORIDE 20 MG/ML IJ SOLN
INTRAMUSCULAR | Status: DC | PRN
  Administered 2012-04-28: 100 mg via INTRAVENOUS

## 2012-04-28 MED ORDER — LIDOCAINE HCL (CARDIAC) 20 MG/ML IV SOLN
INTRAVENOUS | Status: DC | PRN
  Administered 2012-04-28: 70 mg via INTRAVENOUS

## 2012-04-28 MED ORDER — ROCURONIUM BROMIDE 100 MG/10ML IV SOLN
INTRAVENOUS | Status: DC | PRN
  Administered 2012-04-28: 25 mg via INTRAVENOUS
  Administered 2012-04-28: 15 mg via INTRAVENOUS

## 2012-04-28 MED ORDER — NEOSTIGMINE METHYLSULFATE 1 MG/ML IJ SOLN
INTRAMUSCULAR | Status: DC | PRN
  Administered 2012-04-28: 4 mg via INTRAVENOUS

## 2012-04-28 MED ORDER — SODIUM CHLORIDE 0.9 % IR SOLN
Status: DC | PRN
  Administered 2012-04-28: 1

## 2012-04-28 MED ORDER — CEFAZOLIN SODIUM-DEXTROSE 2-3 GM-% IV SOLR
INTRAVENOUS | Status: DC | PRN
  Administered 2012-04-28: 2 g via INTRAVENOUS

## 2012-04-28 MED ORDER — SODIUM CHLORIDE 0.9 % IV SOLN
INTRAVENOUS | Status: DC | PRN
  Administered 2012-04-28: 16:00:00

## 2012-04-28 MED ORDER — HYDROMORPHONE HCL PF 1 MG/ML IJ SOLN
0.2500 mg | INTRAMUSCULAR | Status: DC | PRN
Start: 2012-04-28 — End: 2012-04-28
  Administered 2012-04-28: 0.5 mg via INTRAVENOUS
  Administered 2012-04-28 (×3): via INTRAVENOUS

## 2012-04-28 MED ORDER — ENOXAPARIN SODIUM 40 MG/0.4ML ~~LOC~~ SOLN
40.0000 mg | SUBCUTANEOUS | Status: DC
Start: 2012-04-29 — End: 2012-04-29
  Administered 2012-04-29: 40 mg via SUBCUTANEOUS
  Filled 2012-04-28 (×2): qty 0.4

## 2012-04-28 MED ORDER — BUPIVACAINE-EPINEPHRINE 0.25% -1:200000 IJ SOLN
INTRAMUSCULAR | Status: DC | PRN
  Administered 2012-04-28: 20 mL

## 2012-04-28 MED ORDER — HYDROCODONE-ACETAMINOPHEN 5-325 MG PO TABS: 1.0000 | ORAL_TABLET | ORAL | Status: DC | PRN

## 2012-04-28 MED ORDER — MIDAZOLAM HCL 5 MG/5ML IJ SOLN
INTRAMUSCULAR | Status: DC | PRN
  Administered 2012-04-28: 2 mg via INTRAVENOUS

## 2012-04-28 MED ORDER — GLYCOPYRROLATE 0.2 MG/ML IJ SOLN
INTRAMUSCULAR | Status: DC | PRN
  Administered 2012-04-28: 0.6 mg via INTRAVENOUS

## 2012-04-28 MED ORDER — PROPOFOL 10 MG/ML IV BOLUS
INTRAVENOUS | Status: DC | PRN
  Administered 2012-04-28: 150 mg via INTRAVENOUS

## 2012-04-28 MED ORDER — ONDANSETRON HCL 4 MG/2ML IJ SOLN
INTRAMUSCULAR | Status: DC | PRN
  Administered 2012-04-28: 4 mg via INTRAVENOUS

## 2012-04-28 MED ORDER — DEXAMETHASONE SODIUM PHOSPHATE 10 MG/ML IJ SOLN
INTRAMUSCULAR | Status: DC | PRN
  Administered 2012-04-28: 8 mg via INTRAVENOUS

## 2012-04-28 SURGICAL SUPPLY — 41 items
APPLIER CLIP 5 13 M/L LIGAMAX5 (MISCELLANEOUS) ×2
APPLIER CLIP ROT 10 11.4 M/L (STAPLE)
BLADE SURG ROTATE 9660 (MISCELLANEOUS) IMPLANT
CANISTER SUCTION 2500CC (MISCELLANEOUS) ×2 IMPLANT
CHLORAPREP W/TINT 26ML (MISCELLANEOUS) ×2 IMPLANT
CHOLANGIOGRAM CATH TAUT (CATHETERS) ×2 IMPLANT
CLIP APPLIE 5 13 M/L LIGAMAX5 (MISCELLANEOUS) ×1 IMPLANT
CLIP APPLIE ROT 10 11.4 M/L (STAPLE) IMPLANT
CLOTH BEACON ORANGE TIMEOUT ST (SAFETY) ×2 IMPLANT
COVER MAYO STAND STRL (DRAPES) ×2 IMPLANT
COVER SURGICAL LIGHT HANDLE (MISCELLANEOUS) ×2 IMPLANT
DECANTER SPIKE VIAL GLASS SM (MISCELLANEOUS) ×2 IMPLANT
DERMABOND ADVANCED (GAUZE/BANDAGES/DRESSINGS) ×1
DERMABOND ADVANCED .7 DNX12 (GAUZE/BANDAGES/DRESSINGS) ×1 IMPLANT
DRAPE C-ARM 42X72 X-RAY (DRAPES) ×2 IMPLANT
DRAPE UTILITY 15X26 W/TAPE STR (DRAPE) ×4 IMPLANT
ELECT REM PT RETURN 9FT ADLT (ELECTROSURGICAL) ×2
ELECTRODE REM PT RTRN 9FT ADLT (ELECTROSURGICAL) ×1 IMPLANT
FILTER SMOKE EVAC LAPAROSHD (FILTER) ×2 IMPLANT
GLOVE SURG SIGNA 7.5 PF LTX (GLOVE) ×2 IMPLANT
GOWN STRL NON-REIN LRG LVL3 (GOWN DISPOSABLE) ×6 IMPLANT
GOWN STRL REIN XL XLG (GOWN DISPOSABLE) ×2 IMPLANT
IV CATH 14GX2 1/4 (CATHETERS) ×2 IMPLANT
KIT BASIN OR (CUSTOM PROCEDURE TRAY) ×2 IMPLANT
KIT ROOM TURNOVER OR (KITS) ×2 IMPLANT
NS IRRIG 1000ML POUR BTL (IV SOLUTION) ×2 IMPLANT
PAD ARMBOARD 7.5X6 YLW CONV (MISCELLANEOUS) ×2 IMPLANT
POUCH SPECIMEN RETRIEVAL 10MM (ENDOMECHANICALS) ×2 IMPLANT
SCISSORS LAP 5X35 DISP (ENDOMECHANICALS) IMPLANT
SET IRRIG TUBING LAPAROSCOPIC (IRRIGATION / IRRIGATOR) ×2 IMPLANT
SPECIMEN JAR SMALL (MISCELLANEOUS) ×2 IMPLANT
STOPCOCK 4 WAY LG BORE MALE ST (IV SETS) ×2 IMPLANT
SUT VIC AB 5-0 PS2 18 (SUTURE) ×2 IMPLANT
TOWEL OR 17X24 6PK STRL BLUE (TOWEL DISPOSABLE) ×2 IMPLANT
TOWEL OR 17X26 10 PK STRL BLUE (TOWEL DISPOSABLE) ×2 IMPLANT
TRAY LAPAROSCOPIC (CUSTOM PROCEDURE TRAY) ×2 IMPLANT
TROCAR XCEL BLUNT TIP 100MML (ENDOMECHANICALS) ×2 IMPLANT
TROCAR Z-THREAD FIOS 11X100 BL (TROCAR) IMPLANT
TROCAR Z-THREAD FIOS 5X100MM (TROCAR) ×4 IMPLANT
TUBING EXTENTION W/L.L. (IV SETS) ×2 IMPLANT
WATER STERILE IRR 1000ML POUR (IV SOLUTION) ×2 IMPLANT

## 2012-04-28 NOTE — Anesthesia Postprocedure Evaluation (Signed)
  Anesthesia Post-op Note  Patient: Diana Velasquez  Procedure(s) Performed: Procedure(s): LAPAROSCOPIC CHOLECYSTECTOMY WITH INTRAOPERATIVE CHOLANGIOGRAM (N/A)  Patient Location: PACU  Anesthesia Type:General  Level of Consciousness: awake  Airway and Oxygen Therapy: Patient Spontanous Breathing  Post-op Pain: mild  Post-op Assessment: Post-op Vital signs reviewed  Post-op Vital Signs: Reviewed  Complications: No apparent anesthesia complications

## 2012-04-28 NOTE — Transfer of Care (Signed)
Immediate Anesthesia Transfer of Care Note  Patient: Diana Velasquez  Procedure(s) Performed: Procedure(s): LAPAROSCOPIC CHOLECYSTECTOMY WITH INTRAOPERATIVE CHOLANGIOGRAM (N/A)  Patient Location: PACU  Anesthesia Type:General  Level of Consciousness: awake, alert  and oriented  Airway & Oxygen Therapy: Patient Spontanous Breathing  Post-op Assessment: Report given to PACU RN and Post -op Vital signs reviewed and stable  Post vital signs: Reviewed and stable  Complications: No apparent anesthesia complications

## 2012-04-28 NOTE — Addendum Note (Signed)
Addendum created 04/28/12 1754 by Jeani Hawking, CRNA   Modules edited: Anesthesia Medication Administration

## 2012-04-28 NOTE — Progress Notes (Signed)
Interpreter at bedside, pt complaining pain is a 7 , abdominall sites dry and intact, complaint of sore throat and chest aches, pulled up in bed feels much better encouraged  to cough and deep breathe , explained through interpreter that pt has CO2 put uin abdomen and sore throat is from tube from breathing machine

## 2012-04-28 NOTE — Progress Notes (Signed)
Dr Ezzard Standing has spoke to family.  They are waiting on pt in her room.

## 2012-04-28 NOTE — Anesthesia Preprocedure Evaluation (Addendum)
Anesthesia Evaluation  Patient identified by MRN, date of birth, ID band Patient awake    Reviewed: Allergy & Precautions, H&P , NPO status , Patient's Chart, lab work & pertinent test results  Airway Mallampati: II TM Distance: >3 FB Neck ROM: Full    Dental  (+) Dental Advisory Given,    Pulmonary neg pulmonary ROS,  breath sounds clear to auscultation        Cardiovascular Rhythm:Regular Rate:Normal     Neuro/Psych    GI/Hepatic Neg liver ROS, History of gallstones.   Endo/Other  negative endocrine ROS  Renal/GU      Musculoskeletal   Abdominal   Peds  Hematology   Anesthesia Other Findings   Reproductive/Obstetrics                          Anesthesia Physical Anesthesia Plan  ASA: III  Anesthesia Plan: General   Post-op Pain Management:    Induction: Intravenous  Airway Management Planned: Oral ETT  Additional Equipment:   Intra-op Plan:   Post-operative Plan: Extubation in OR  Informed Consent:   Plan Discussed with: CRNA, Anesthesiologist and Surgeon  Anesthesia Plan Comments:         Anesthesia Quick Evaluation

## 2012-04-28 NOTE — Op Note (Signed)
04/27/2012 - 04/28/2012  5:39 PM  PATIENT:  Diana Velasquez, 37 y.o., female, MRN: [id number]  PREOP DIAGNOSIS:  Gall Bladder Disease  POSTOP DIAGNOSIS:   Acute edematous cholecystitis, stones impacted in the cystic duct.  PROCEDURE:   Procedure(s): LAPAROSCOPIC CHOLECYSTECTOMY WITH INTRAOPERATIVE CHOLANGIOGRAM  SURGEON:   Ovidio Kin, M.D.  ASSISTANT:   Gwyndolyn Kaufman, M.D.  ANESTHESIA:   general  Anesthesiologist: Judie Petit, MD CRNA: Jeani Hawking, CRNA  General  EBL:  minimal  ml  BLOOD ADMINISTERED: none  DRAINS: none   LOCAL MEDICATIONS USED:   30 cc 1/4% marcaine  SPECIMEN:   Gall bladder  COUNTS CORRECT:  YES  INDICATIONS FOR PROCEDURE:  Deb Gibbins is a 37 y.o. (DOB: 1975-04-11) Burmese  female whose primary care physician is Default, Provider, MD and comes for cholecystectomy.   The indications and risks of the gall bladder surgery were explained to the patient.  The risks include, but are not limited to, infection, bleeding, common bile duct injury and open surgery.  SURGERY:  The patient was taken to room #1 at [facility].  The abdomen was prepped with chloroprep.  The patient was given 2 gm Ancef at the beginning of the operation.   A time out was held and the surgical checklist run.   An infraumbilical incision was made into the abdominal cavity.  A 12 mm Hasson trocar was inserted into the abdominal cavity through the infraumbilical incision and secured with a 0 Vicryl suture.  Three additional trocars were inserted: a 10 mm trocar in the sub-xiphoid location, a 5 mm trocar in the right mid subcostal area, and a 5 mm trocar in the right lateral subcostal area.   The abdomen was explored and the liver, stomach, and bowel that could be seen were unremarkable.   The gall bladder was identified, grasped, and rotated cephalad.  The gall bladder was distended and consistent with edematous cholecystitis.  Disssection was carried down to the gall bladder/cystic duct  junction and the cystic duct isolated.  A clip was placed on the gall bladder side of the cystic duct.   An incision was made in the cystic duct and I extracted four 3-4 mm stones which were impacted.   An intra-operative cholangiogram was shot.   The intra-operative cholangiogram was shot using a cut off Taut catheter placed through a 14 gauge angiocath in the RUQ.  The Taut catheter was inserted in the cut cystic duct and secured with an endoclip.  A cholangiogram was shot with 10 cc of 1/2 strength Omnipaque.  Using fluoroscopy, the cholangiogram showed the flow of contrast into the common bile duct, up the hepatic radicals, and into the duodenum.  There was no mass or obstruction.  This was a normal intra-operative cholangiogram.   The Taut catheter was removed.  The cystic duct was tripley endoclipped and the cystic artery was identified and clipped.  The gall bladder was bluntly and sharpley dissected from the gall bladder bed.   After the gall bladder was removed from the liver, the gall bladder bed and Triangle of [place] were inspected.  There was no bleeding or bile leak.  The gall bladder was placed in a endocatch bag and delivered through the umbilicus.  The abdomen was irrigated with 600 cc saline.   The trocars were then removed.  I infiltrated 30cc of 1/4% Marcaine into the incisions.  The umbilical port closed with a two 0 Vicryl sutures and the skin closed with 5-0 vicryl.  The skin was painted with Dermabond.  The patient's sponge and needle count were correct.  The patient was transported to the RR in good condition.  Ovidio Kin, MD, [facility] Surgery Pager: [phone] Office phone:  [phone]

## 2012-04-28 NOTE — Progress Notes (Signed)
Pt states pain much better wants to go to room . Transported via bed , has  family in room who  speaks   english , interpreter has left

## 2012-04-28 NOTE — ED Provider Notes (Signed)
Medical screening examination/treatment/procedure(s) were performed by non-physician practitioner and as supervising physician I was immediately available for consultation/collaboration.  Deunte Bledsoe K Linker, MD 04/28/12 0706 

## 2012-04-28 NOTE — Preoperative (Signed)
Beta Blockers   Reason not to administer Beta Blockers:Not Applicable 

## 2012-04-29 ENCOUNTER — Telehealth (INDEPENDENT_AMBULATORY_CARE_PROVIDER_SITE_OTHER): Payer: Self-pay

## 2012-04-29 ENCOUNTER — Other Ambulatory Visit (INDEPENDENT_AMBULATORY_CARE_PROVIDER_SITE_OTHER): Payer: Self-pay | Admitting: General Surgery

## 2012-04-29 DIAGNOSIS — E049 Nontoxic goiter, unspecified: Secondary | ICD-10-CM

## 2012-04-29 MED ORDER — HYDROCODONE-ACETAMINOPHEN 5-325 MG PO TABS: 1.0000 | ORAL_TABLET | ORAL | Status: DC | PRN

## 2012-04-29 NOTE — Discharge Summary (Signed)
Patient ID: Diana Velasquez MRN: [id number] DOB/AGE: Aug 11, 1975 37 y.o.  Admit date: 04/27/2012 Discharge date: 04/29/2012  Procedures: laparoscopic cholecystectomy with IOC - D. Kaladin Noseworthy - 04/28/2012  Consults: None  Reason for Admission: 37 yo female from [place] with a history of GERD who presents today with abdominal pain. This pain has been going on since the birth of her last pregnancy in December. She has previously been at the ED two times for the same complaint. Both times her diagnosis was with reflux and the first time she was treated with zantac and the second time with omeprazole. The patient is unsure if these medications have been helping. The patient is unresponsive to many questions. The pain is localized at the epigastric area and left quadrant of the abdomen more upper than lower, but when palpating she is tender in the RUQ and epigastric area and not the left side. The pain is waxing and waning and is increased with foods that are spicy or carbonated. She ate macaroni and cheese yesterday and that increased her pain. Complains of shortness of breath and very yellow urine. Denies fever, nausea, vomiting, or changes in her bowel movement.  Does not see a primary care physician.  \ Admission Diagnoses:  1. Acute cholecystitis 2. Thyroid mass  Will need follow up with US guided biopsy. 3. Right renal nodule   Hospital Course:  The patient was admitted and taken to the operating room where she underwent a lap chole with IOC.  She tolerated the procedure well.  On POD# 1, she was tolerating a regular diet and her pain was well controlled.  She was stable for dc home.  She had an incidental find of a thyroid mass on admission.  I will order a TSH prior to discharge as well as getting her set up for an outpatient ultrasound and biopsy.  Discharge Diagnoses:  1. Acute cholecystitis, s/p lap chole 2. Thyroid mass  Discharge Medications:   Medication List    TAKE these medications       HYDROcodone-acetaminophen 5-325 MG per tablet  Commonly known as:  NORCO/VICODIN  Take 1-2 tablets by mouth every 4 (four) hours as needed.     omeprazole 20 MG capsule  Commonly known as:  PRILOSEC  Take 1 capsule (20 mg total) by mouth daily.        Discharge Instructions:     Follow-up Information   Follow up with [facility] On 05/19/2012. (11:45am, arrive at 11:15pm)    Contact information:   [address]   [place] [address] [phone]      follow up with [facility] for PCP  Signed: OSBORNE,KELLY E 04/29/2012, 9:05 AM  Agree with above.  Ovidio Kin, MD, [facility] Surgery Pager: [phone] Office phone:  [phone]

## 2012-04-29 NOTE — Progress Notes (Signed)
Pt.   Insisted on leaving without waiting for speech translator because of weather. Stated husband is to pick she and son up. He is a new driver and very concerned about snow storm. Son speaks english and interpeted  D/c instructions to mom. Both stated they understood instructions verbally.

## 2012-04-29 NOTE — Telephone Encounter (Signed)
Informed family member of appt 05/16/11@1 :50 advised him to be here at 1:30 for paper work. Family member will come to appt with patient  for english  Translation.

## 2012-04-30 ENCOUNTER — Encounter (HOSPITAL_COMMUNITY): Payer: Self-pay | Admitting: Surgery

## 2012-05-06 ENCOUNTER — Ambulatory Visit
Admission: RE | Admit: 2012-05-06 | Discharge: 2012-05-06 | Disposition: A | Discharge status: Home / Self Care | Payer: Medicaid Other | Source: Ambulatory Visit | Attending: General Surgery | Admitting: General Surgery

## 2012-05-06 ENCOUNTER — Other Ambulatory Visit (HOSPITAL_COMMUNITY)
Admission: RE | Admit: 2012-05-06 | Discharge: 2012-05-06 | Disposition: A | Discharge status: Home / Self Care | Payer: Medicaid Other | Source: Ambulatory Visit | Attending: Interventional Radiology | Admitting: Interventional Radiology

## 2012-05-06 DIAGNOSIS — E049 Nontoxic goiter, unspecified: Secondary | ICD-10-CM

## 2012-05-15 ENCOUNTER — Encounter (INDEPENDENT_AMBULATORY_CARE_PROVIDER_SITE_OTHER): Payer: Self-pay | Admitting: Surgery

## 2012-05-15 ENCOUNTER — Ambulatory Visit (INDEPENDENT_AMBULATORY_CARE_PROVIDER_SITE_OTHER): Payer: Self-pay | Admitting: Surgery

## 2012-05-15 VITALS — BP 128/76 | HR 72 | Temp 97.6°F | Resp 18 | Ht 60.0 in | Wt 148.0 lb

## 2012-05-15 DIAGNOSIS — E041 Nontoxic single thyroid nodule: Secondary | ICD-10-CM | POA: Insufficient documentation

## 2012-05-15 DIAGNOSIS — K829 Disease of gallbladder, unspecified: Secondary | ICD-10-CM | POA: Insufficient documentation

## 2012-05-15 NOTE — Progress Notes (Signed)
[facility]  Ovidio Kin, MD,  FACS [address] [place]  [address] [place], [place]    [address] Phone:  [phone] FAX:  [phone]   Re:   Puanani Gene DOB:   [birth year] MRN:   [id number]  Speaks Burmese - language an issue  ASSESSMENT AND PLAN: 1. Acute cholecystitis - s/p lap chole with IOC - D. Katelin Kutsch - 04/28/2012  She points to her umbilicus as causing discomfort.  But overall looks good.   I wrote a note that her husband should be out of work to help her until 05/23/2012.  2. 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    TSH - 0.417 - 04/29/2012  Will be best served by a left thyroid lobectomy.  Discussed indications of surgery, risks of surgery which include bleeding, infection, nerve injury (specifically the recurrent laryngeal nerve), and parathyroid injury.  We had the translator on the phone.  She will see me back 06/30/2012 with her son to re-review the findings and to schedule surgery.  3. Right renal nodule   Inderminate 11 mm nodule.  I'm not sure she knows about this, so I will discuss this the next time I see her.  HISTORY OF PRESENT ILLNESS: Chief Complaint  Patient presents with  . Routine Post Op    Lap chole    Charly Kosek is a 37 y.o. (DOB: 30-Sep-1975)  Burmese  female who is a patient of Default, Provider, MD and comes to me today for follow up for gall bladder disease and review the results of a thyroid biopsy.  She has no known problems with her thyroid.  She was in the hospital at [facility] with acute cholecystitis.  The thyroid mass was picked up on a PE and seen on CT scan.  She also was noted to have an indertminate nodule in her right kidney.  Will make sure that next time I see her, she knows about this.  Social History: Husband is with her. She has at least 4 children.  Her oldest son is at [employer].  PHYSICAL EXAM: BP 128/76  Pulse 72  Temp(Src) 97.6 F (36.4 C)   Resp 18  Ht 5' (1.524 m)  Wt 148 lb (67.132 kg)  BMI 28.9 kg/m2  General: WN F who is alert and generally healthy appearing.  HEENT: Normal. Pupils equal.  Neck: Supple. No mass.  Fullness in the left thyroid consistent with mass.   I feel no adenopathy. Lymph Nodes:  No supraclavicular or cervical nodes. Lungs: Clear to auscultation and symmetric breath sounds. Heart:  RRR. No murmur or rub. Abdomen: Soft. No mass. No hernia. Normal bowel sounds.  Abdominal incisions look good.  DATA REVIEWED: Path reports on gall bladder and thyroid.   Ovidio Kin, MD, FACS Office:  [phone]

## 2012-05-19 ENCOUNTER — Encounter (INDEPENDENT_AMBULATORY_CARE_PROVIDER_SITE_OTHER): Payer: Medicaid Other

## 2012-05-19 ENCOUNTER — Encounter (INDEPENDENT_AMBULATORY_CARE_PROVIDER_SITE_OTHER): Payer: Self-pay

## 2012-06-30 ENCOUNTER — Ambulatory Visit (INDEPENDENT_AMBULATORY_CARE_PROVIDER_SITE_OTHER): Payer: Medicaid Other | Admitting: Surgery

## 2012-06-30 VITALS — BP 136/78 | HR 74 | Temp 98.4°F | Resp 18 | Ht 66.0 in | Wt 152.0 lb

## 2012-06-30 DIAGNOSIS — E041 Nontoxic single thyroid nodule: Secondary | ICD-10-CM

## 2012-06-30 NOTE — Progress Notes (Signed)
[facility]  Ovidio Kin, MD,  FACS [address] [place]  [address] [place], [place]    [address] Phone:  [phone] FAX:  [phone]   Re:   Diana Velasquez DOB:   December 10, 1975 MRN:   [id number]  Speaks Burmese (or a dialect of Burmese) - language an issue  ASSESSMENT AND PLAN: 1. Acute cholecystitis - s/p lap chole with IOC - D. Cortni Tays - 04/28/2012  She has done well from this.  2. 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    TSH - 0.417 - 04/29/2012  Will be best served by a left thyroid lobectomy.  Discussed indications of surgery, risks of surgery which include bleeding, infection, nerve injury (specifically the recurrent laryngeal nerve), and parathyroid injury.  We had the translator on the phone at the 05/15/2012 visit.  This time she has her son, Diana Velasquez, with her.  He acted as her Nurse, learning disability at the hospital and did a good job.  I gave them copies of the CT scan, the US of the thyroid, and the path report.  She would like to wait since she just had surgery.  I am going to see her back in July.  At that time, we can get a MRI of her kidney, to clear that up.  And we can schedule her thyroid surgery, since her son will be out of school.  He is planning to work this summer at [employer] (or the like).  3. Right renal nodule   Inderminate 11 mm nodule.  I spoke with Dr. Marquette Saa.  He recommended follow up MRI with IV contrast in 2 to 3 months from now.  That would be an interval from the CT scan.  He thought it would be better than a CT scan.  I'm not sure she knows about this, so I will discuss this the next time I see her.  HISTORY OF PRESENT ILLNESS: Chief Complaint  Patient presents with  . Establish Care    follow up thyroid    Diana Velasquez is a 37 y.o. (DOB: 1975/09/18)  Burmese  female who is a patient of Default, Provider, MD and comes to me today for follow up of her thyroid biopsy.  She also had her son  with her.   She said that she had noticed something in her neck for about 8 years.  With each child, the mass got a little bigger.  She said many people in [place] have masses in their neck.  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  She's had no hoarseness.  No family history of particular thyroid disease, though how well she understands that concept is unclear.  Social History: Her son, Diana Velasquez, is with her. She has at least 4 children.  Her oldest son is at [employer].  PHYSICAL EXAM: BP 136/78  Pulse 74  Temp(Src) 98.4 F (36.9 C)  Resp 18  Ht 5\' 6"  (1.676 m)  Wt 152 lb (68.947 kg)  BMI 24.55 kg/m2  General: WN F who is alert and generally healthy appearing.  HEENT: Normal. Pupils equal.  Neck: Supple. No mass.  Fullness in the left thyroid consistent with mass.   I feel no adenopathy. Lymph Nodes:  No supraclavicular or cervical nodes. Lungs: Clear to auscultation and symmetric breath sounds. Heart:  RRR. No murmur or rub. Abdomen: Soft. No mass. No hernia. Normal bowel sounds.  Abdominal incisions look good.  DATA REVIEWED: The son, Diana Velasquez, is with the patient.  He is a Medical sales representative at [employer]. I gave them copies of the CT scan, the US of the thyroid, and the path report.  I explained the reports and showed them pictures on the computer of what I am talking about.  Ovidio Kin, MD, FACS Office:  [phone]

## 2012-08-19 ENCOUNTER — Telehealth (INDEPENDENT_AMBULATORY_CARE_PROVIDER_SITE_OTHER): Payer: Self-pay

## 2012-08-19 NOTE — Telephone Encounter (Signed)
Patients interpreter/church member will inform patient of appointment with Dr. Ezzard Standing 10/01/12@4 :10 She also will ask her to bring her son with her  to translate .

## 2012-10-01 ENCOUNTER — Ambulatory Visit (INDEPENDENT_AMBULATORY_CARE_PROVIDER_SITE_OTHER): Payer: Self-pay | Admitting: Surgery

## 2012-10-01 VITALS — BP 102/60 | HR 80 | Resp 18 | Ht 60.0 in | Wt 159.2 lb

## 2012-10-01 DIAGNOSIS — E041 Nontoxic single thyroid nodule: Secondary | ICD-10-CM

## 2012-10-01 NOTE — Progress Notes (Signed)
[facility]  Ovidio Kin, MD,  FACS [address]  [address] [place], [place]    [address] Phone:  [phone] FAX:  [phone]   Re:   Diana Velasquez DOB:   [birth year] MRN:   [id number]  Speaks Burmese (or a dialect of Burmese) - language an issue  ASSESSMENT AND PLAN: 1. Acute cholecystitis - s/p lap chole with IOC - D. Ethin Drummond - 04/28/2012  She has done well from this.  2. Left thyroid mass   US shows 6.9 cm left lower pole mass  US guided biopsy - folicullar epithelium in a predominantly microfollicular pattern.  Differential is adenomatous nodule vs low grade malignancy.  TSH - 0.417 - 04/29/2012  We have discussed and reviewed left thyroid lobectomy.  There has been no change in this mass on PE.  I think that she has had this longstanding. She is waiting on a Medicaid card and they have no money.  They have paid no bills.   I will see her back in 3 months (with her son) and go from there.  3. Right renal nodule   Inderminate 11 mm nodule.  I spoke with Dr. Marquette Saa.  He recommended follow up MRI with IV contrast in 2 to 3 months from now.  That would be an interval from the CT scan.  He thought it would be better than a CT scan.  I reviewed this with the son.  I gave him a copy of the CT scan.  Again, since they have no money and are awaiting a Medicaid card, they will hold off on this.  HISTORY OF PRESENT ILLNESS: Chief Complaint  Patient presents with  . Follow-up    thy mass    Diana Velasquez is a 37 y.o. (DOB: [birth year])  Burmese  female who is a patient of Default, Provider, MD and comes to me today for follow up of her thyroid biopsy.  She also had her son with her.   She is doing well.  She has no complaint. She has noticed no change in her neck nor does she have any symptoms.  History of thyroid disease: She said that she had noticed something in her neck for about 8 years.  With each child, the mass got a little bigger.  She said many people in  [place] have masses in their neck.  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  She's had no hoarseness.  No family history of particular thyroid disease, though how well she understands that concept is unclear.  Social History: Her son, Diana Velasquez, is with her. She has 4 children.  Her oldest son is at [employer].  He is starting 10th grade.  She also has a 39 year old, a 39 year old, and an 56 month old. Her husband works with produce.  PHYSICAL EXAM: BP 102/60  Pulse 80  Resp 18  Ht 5' (1.524 m)  Wt 159 lb 3.2 oz (72.213 kg)  BMI 31.09 kg/m2  General: WN F who is alert and generally healthy appearing.  HEENT: Normal. Pupils equal.  Neck: Supple.  Fullness in the left thyroid consistent with mass.   I feel no adenopathy. Lymph Nodes:  No supraclavicular or cervical nodes. Lungs: Clear to auscultation and symmetric breath sounds. Heart:  RRR. No murmur or rub.  DATA REVIEWED: The son, Diana Velasquez, is with the patient.  He is a rising 10th grader at [employer].  Ovidio Kin, MD, FACS Office:  [phone]

## 2013-01-06 ENCOUNTER — Ambulatory Visit (INDEPENDENT_AMBULATORY_CARE_PROVIDER_SITE_OTHER): Payer: Self-pay | Admitting: Surgery

## 2013-03-11 ENCOUNTER — Emergency Department (HOSPITAL_COMMUNITY)
Admission: EM | Admit: 2013-03-11 | Discharge: 2013-03-11 | Disposition: A | Discharge status: Home / Self Care | Payer: Self-pay | Attending: Emergency Medicine | Admitting: Emergency Medicine

## 2013-03-11 ENCOUNTER — Emergency Department (HOSPITAL_COMMUNITY): Payer: Self-pay

## 2013-03-11 ENCOUNTER — Encounter (HOSPITAL_COMMUNITY): Payer: Self-pay | Admitting: Emergency Medicine

## 2013-03-11 DIAGNOSIS — S6391XA Sprain of unspecified part of right wrist and hand, initial encounter: Secondary | ICD-10-CM

## 2013-03-11 DIAGNOSIS — Y929 Unspecified place or not applicable: Secondary | ICD-10-CM | POA: Insufficient documentation

## 2013-03-11 DIAGNOSIS — IMO0002 Reserved for concepts with insufficient information to code with codable children: Secondary | ICD-10-CM | POA: Insufficient documentation

## 2013-03-11 DIAGNOSIS — S6390XA Sprain of unspecified part of unspecified wrist and hand, initial encounter: Secondary | ICD-10-CM | POA: Insufficient documentation

## 2013-03-11 DIAGNOSIS — Y939 Activity, unspecified: Secondary | ICD-10-CM | POA: Insufficient documentation

## 2013-03-11 DIAGNOSIS — S6392XA Sprain of unspecified part of left wrist and hand, initial encounter: Secondary | ICD-10-CM

## 2013-03-11 DIAGNOSIS — W010XXA Fall on same level from slipping, tripping and stumbling without subsequent striking against object, initial encounter: Secondary | ICD-10-CM | POA: Insufficient documentation

## 2013-03-11 NOTE — ED Provider Notes (Signed)
CSN: [id number]     Arrival date & time 03/11/13  1040 History  This chart was scribed for non-physician practitioner, Johnnette Gourd, PA-C working with Glynn Octave, MD by Greggory Stallion, ED scribe. This patient was seen in room TR06C/TR06C and the patient's care was started at 12:02 PM.   Chief Complaint  Patient presents with  . Fall   The history is provided by the patient. A language interpreter was used.   HPI Comments: Diana Velasquez is a 37 y.o. female who presents to the Emergency Department complaining of a fall that occurred yesterday night when she slipped in the rain. States she fell forward and landed on her hands and has sudden onset bilateral hand pain and mild back pain. Denies other injuries.   Past Medical History  Diagnosis Date  . No pertinent past medical history    Past Surgical History  Procedure Laterality Date  . No past surgeries    . Cholecystectomy N/A 04/28/2012    Procedure: LAPAROSCOPIC CHOLECYSTECTOMY WITH INTRAOPERATIVE CHOLANGIOGRAM;  Surgeon: Kandis Cocking, MD;  Location: [facility] OR;  Service: General;  Laterality: N/A;   History reviewed. No pertinent family history. History  Substance Use Topics  . Smoking status: Never Smoker   . Smokeless tobacco: Current User    Types: Chew  . Alcohol Use: No   OB History   Grav Para Term Preterm Abortions TAB SAB Ect Mult Living   5 3 3       3      Review of Systems  Musculoskeletal: Positive for arthralgias, back pain and myalgias.  All other systems reviewed and are negative.    Allergies  Review of patient's allergies indicates no known allergies.  Home Medications   No current outpatient prescriptions on file. BP 152/98  Pulse 92  Temp(Src) 98.5 F (36.9 C) (Oral)  Resp 15  SpO2 99%  Physical Exam  Nursing note and vitals reviewed. Constitutional: She is oriented to person, place, and time. She appears well-developed and well-nourished. No distress.  HENT:  Head: Normocephalic and  atraumatic.  Mouth/Throat: Oropharynx is clear and moist.  Eyes: Conjunctivae and EOM are normal.  Neck: Normal range of motion. Neck supple.  Cardiovascular: Normal rate, regular rhythm, normal heart sounds and intact distal pulses.   Pulmonary/Chest: Effort normal and breath sounds normal. No respiratory distress.  Musculoskeletal: Normal range of motion. She exhibits no edema.  Tender at fourth and fifth metacarpals of right hand. Full ROM of bilateral hands. No wrist tenderness. No deformity. Capillary refill less than 3 seconds. No spinous process tenderness.   Neurological: She is alert and oriented to person, place, and time. No sensory deficit.  Strength 5/5 and equal in bilateral lower extremities.  Skin: Skin is warm and dry.  Psychiatric: She has a normal mood and affect. Her behavior is normal.    ED Course  Procedures (including critical care time)  DIAGNOSTIC STUDIES: Oxygen Saturation is 99% on RA, normal by my interpretation.    COORDINATION OF CARE: 12:07 PM-Discussed treatment plan which includes ice and ibuprofen with pt at bedside and pt agreed to plan.   Labs Review Labs Reviewed - No data to display Imaging Review Dg Hand Complete Left  03/11/2013   CLINICAL DATA:  Fall  EXAM: LEFT HAND - COMPLETE 3+ VIEW  COMPARISON:  None.  FINDINGS: No acute fracture.  No dislocation.  Unremarkable soft tissues.  IMPRESSION: No acute bony pathology.   Electronically Signed   By: Maryclare Bean  M.D.   On: 03/11/2013 11:53   Dg Hand Complete Right  03/11/2013   CLINICAL DATA:  Fall  EXAM: RIGHT HAND - COMPLETE 3+ VIEW  COMPARISON:  None.  FINDINGS: No acute fracture.  No dislocation.  Unremarkable soft tissues.  IMPRESSION: No acute bony pathology.   Electronically Signed   By: Maryclare Bean M.D.   On: 03/11/2013 11:50    EKG Interpretation   None       MDM   1. Hand sprain, right, initial encounter   2. Hand sprain, left, initial encounter    Neurovascularly intact. Mild  swelling to right hand. No deformity. X-rays reviewed by me, results without any acute findings. RICE, NSAIDs. Return precautions given. Patient states understanding of treatment care plan and is agreeable.   I personally performed the services described in this documentation, which was scribed in my presence. The recorded information has been reviewed and is accurate.   Trevor Mace, PA-C 03/11/13 1220
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
## 2013-03-12 NOTE — ED Provider Notes (Signed)
Medical screening examination/treatment/procedure(s) were performed by non-physician practitioner and as supervising physician I was immediately available for consultation/collaboration.  EKG Interpretation   None         Antionio Negron, MD 03/12/13 1002 

## 2013-11-24 IMAGING — US US SOFT TISSUE HEAD/NECK
1 series · 14 of 25 positions shown · non-contrast
Comparison: None.

CLINICAL DATA: Left thyroid mass noted on CT.

THYROID ULTRASOUND
TECHNIQUE: Ultrasound examination of the thyroid gland and adjacent
soft tissues was performed.

[Series 1: us soft tissue head/neck · 0.07mm/px · 14 of 46 slices shown]
[im 1/46]
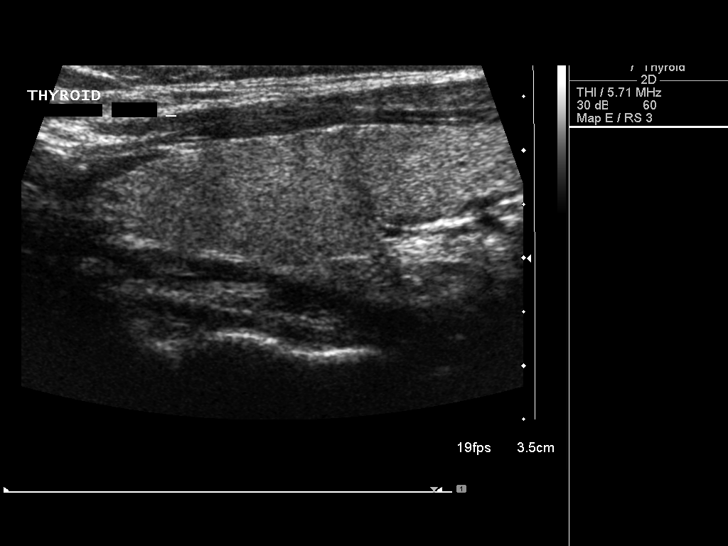
[im 4/46]
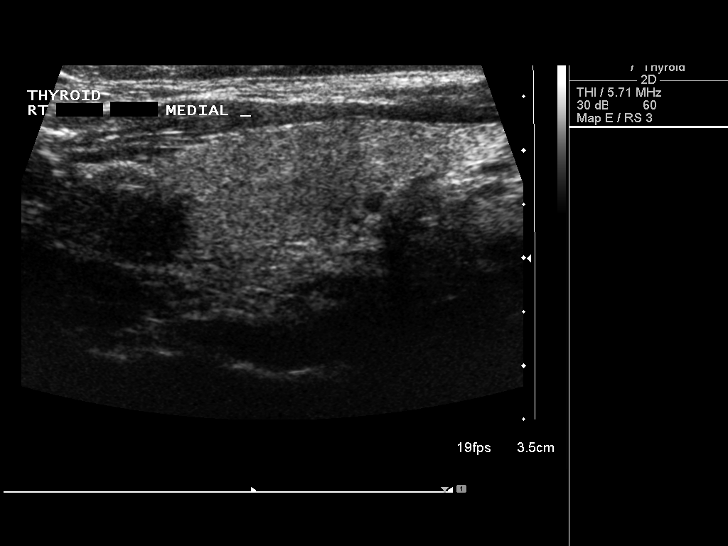
[im 8/46]
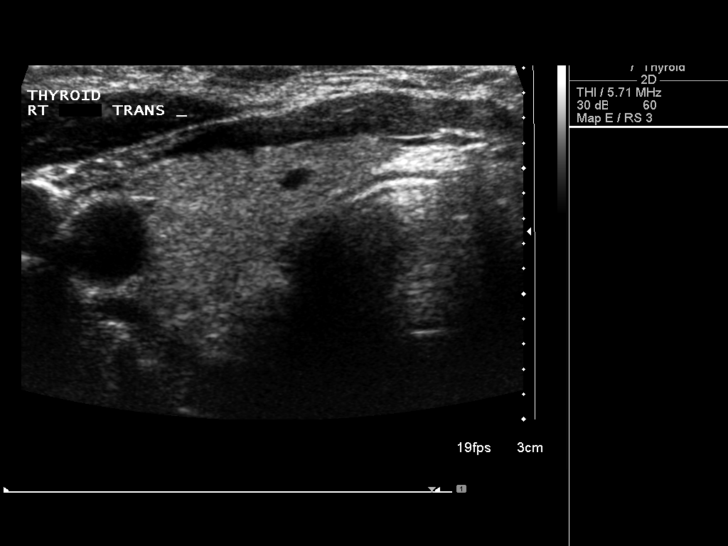
[im 12/46]
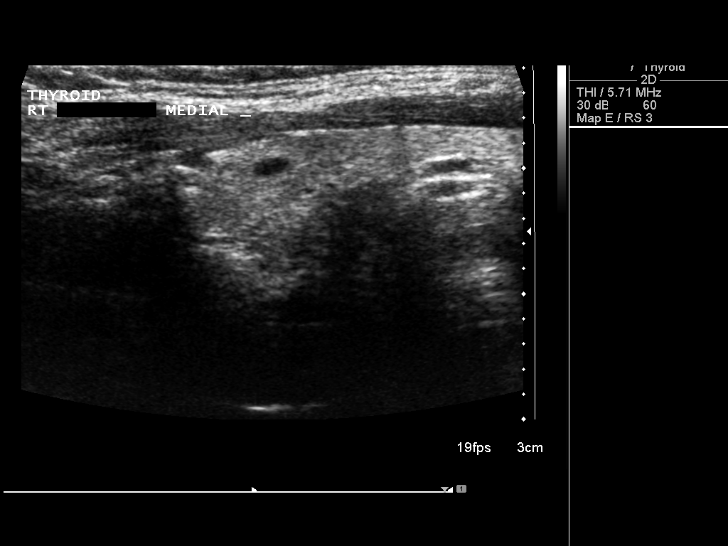
[im 16/46]
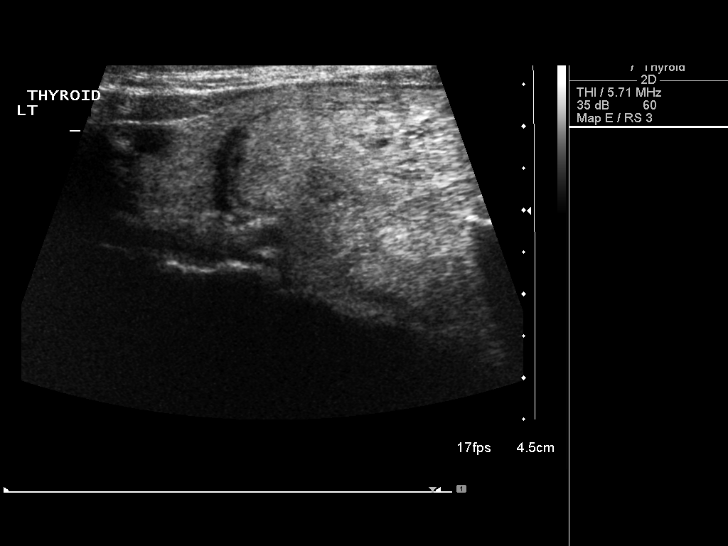
[im 17/46]
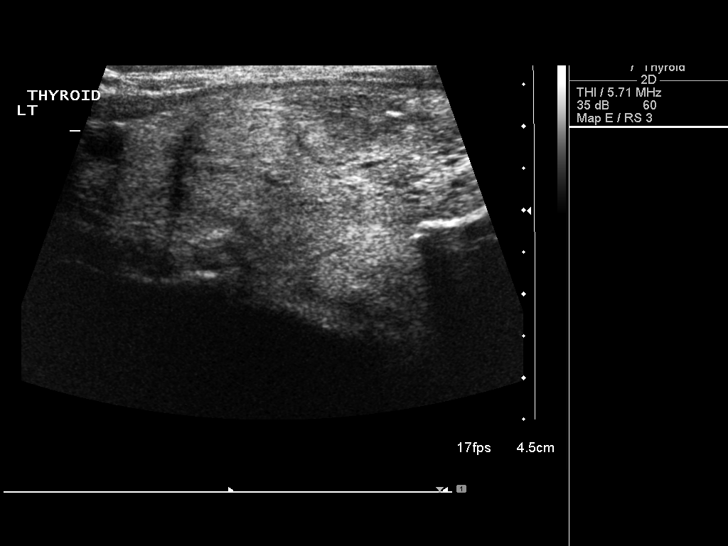
[im 21/46]
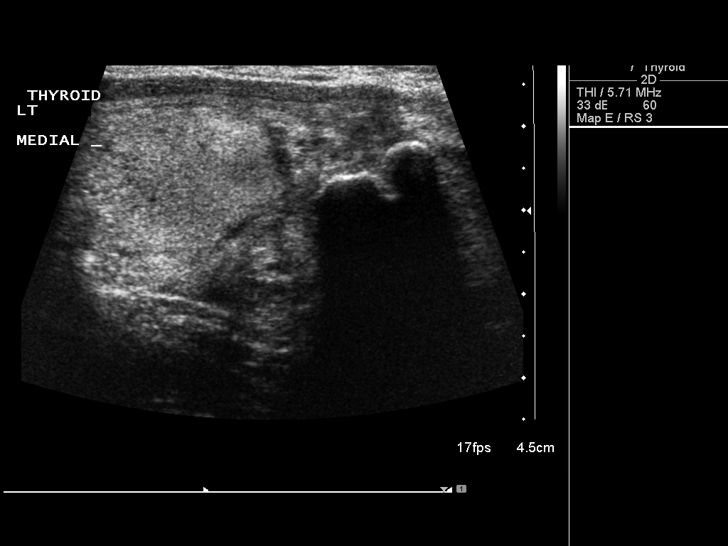
[im 25/46]
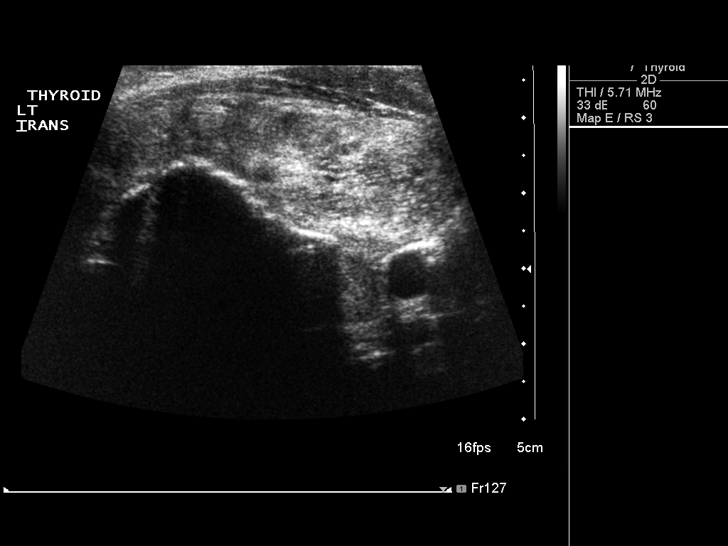
[im 29/46]
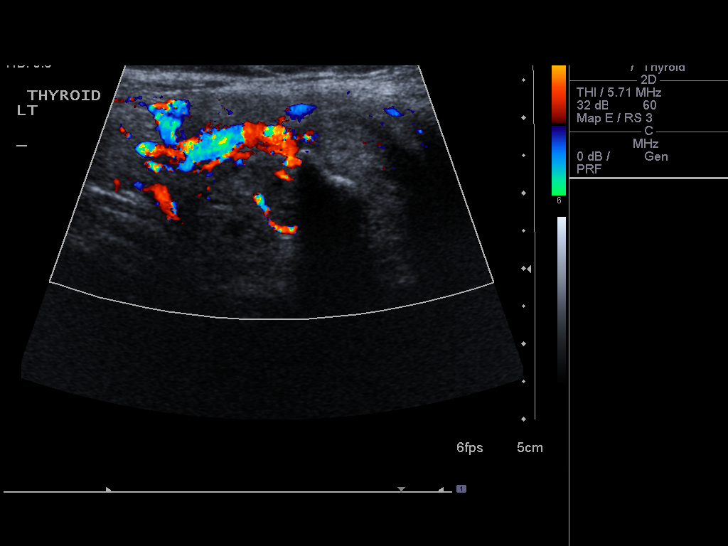
[im 31/46]
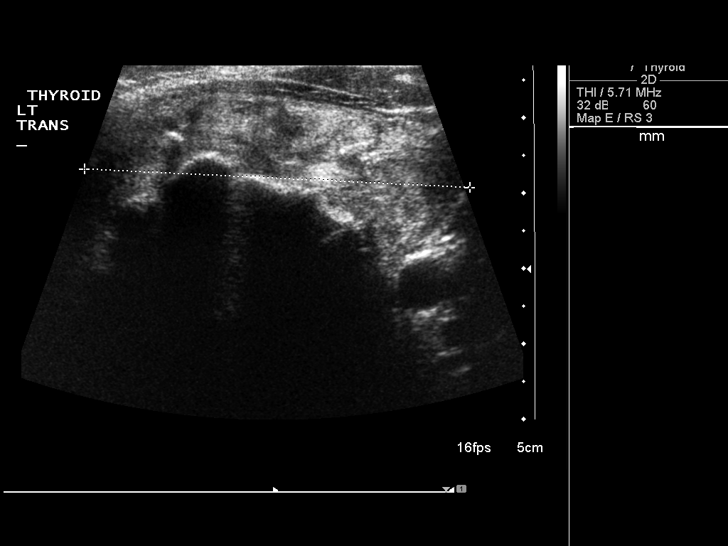
[im 34/46]
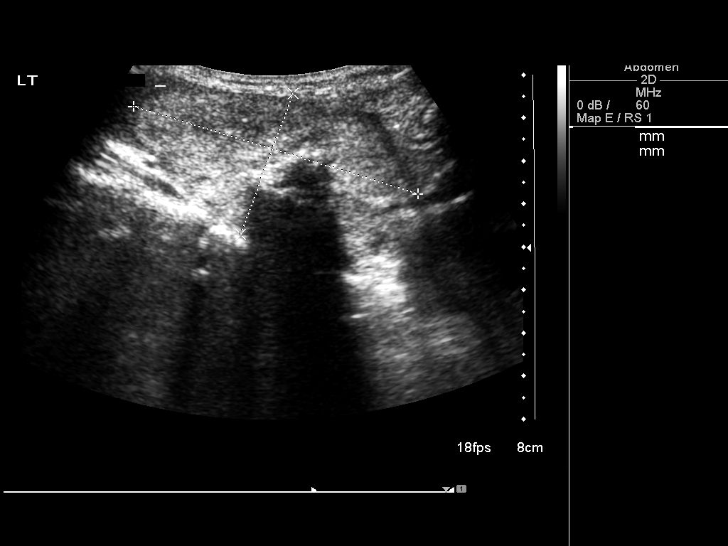
[im 38/46]
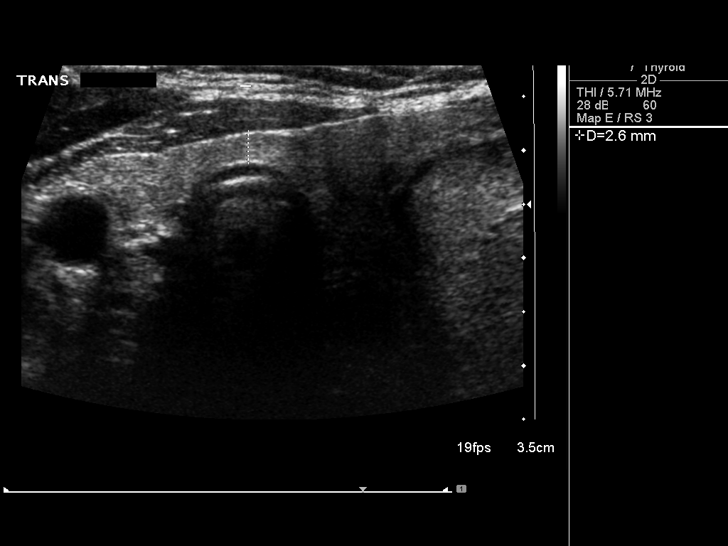
[im 42/46]
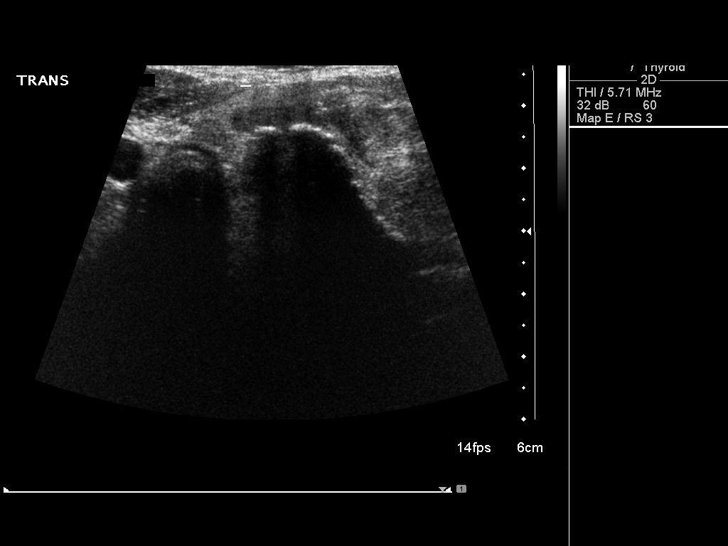
[im 46/46]
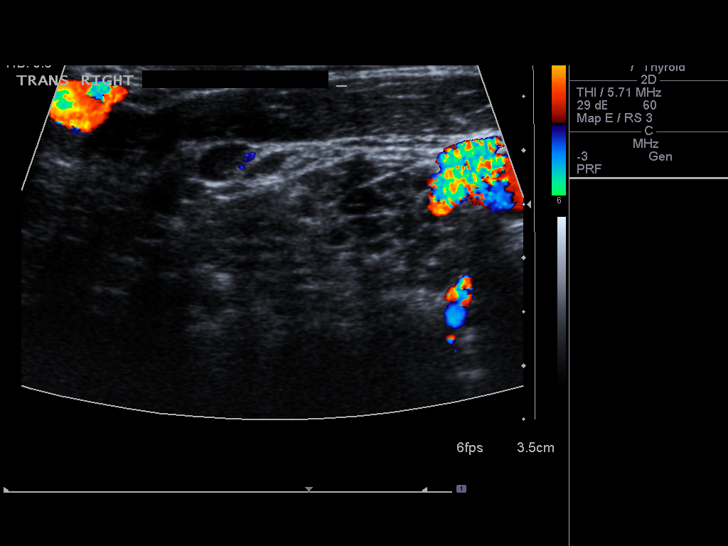

[14 of 25 positions shown; findings below may reference images not displayed]

FINDINGS: Right thyroid lobe:  4.5 x 1.6 x 1.8 cm.
Left thyroid lobe:  8.3 x 3.3 x 5.4 cm.
Isthmus:  3 mm in thickness.

Focal nodules:  There is a large complex mass with calcifications
in the mid and lower pole of the left lobe measuring 6.9 x 3.5 x
5.4 cm.  Internal vascularity is present.

Lymphadenopathy:  None visualized.
IMPRESSION: Large left lobe mass.  Biopsy to follow.

## 2013-11-24 IMAGING — US US THYROID BIOPSY
1 series · 12 of 12 positions shown · non-contrast
Comparison: none

Clinical Data/Indication: Left thyroid mass

ULTRASOUND-GUIDED BIOPSY OF A LARGE LEFT THYROID MASS.  FINE NEEDLE
ASPIRATION.
Procedure: The procedure, risks, benefits, and alternatives were
explained to the patient. Questions regarding the procedure were
encouraged and answered. The patient understands and consents to
the procedure.
The neck was prepped with betadine in a sterile fashion, and a
sterile drape was applied covering the operative field. A sterile
mask and sterile gloves were used for the procedure.
Under sonographic guidance, three 25 gauge fine needle aspirates of
the large left thyroid mass were obtained. Final imaging was
performed.

[Series 1: us thyroid biopsy · 0.08mm/px · 12 acquisitions, 12 frames shown]
[im 1/12]
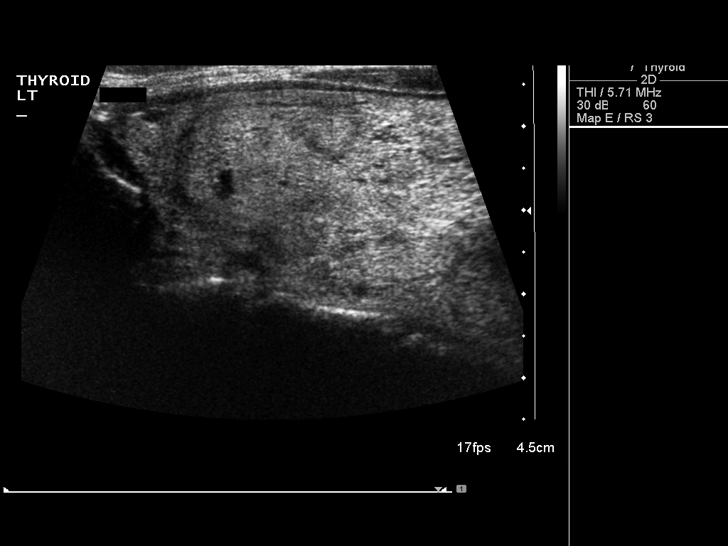
[im 2/12]
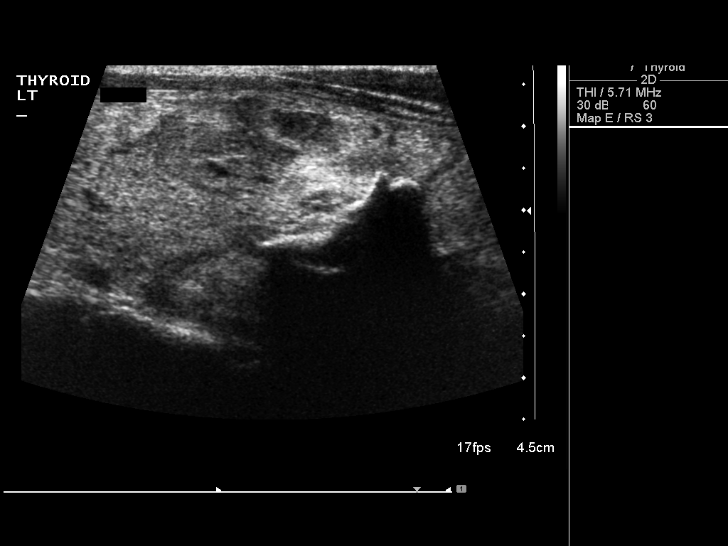
[im 3/12]
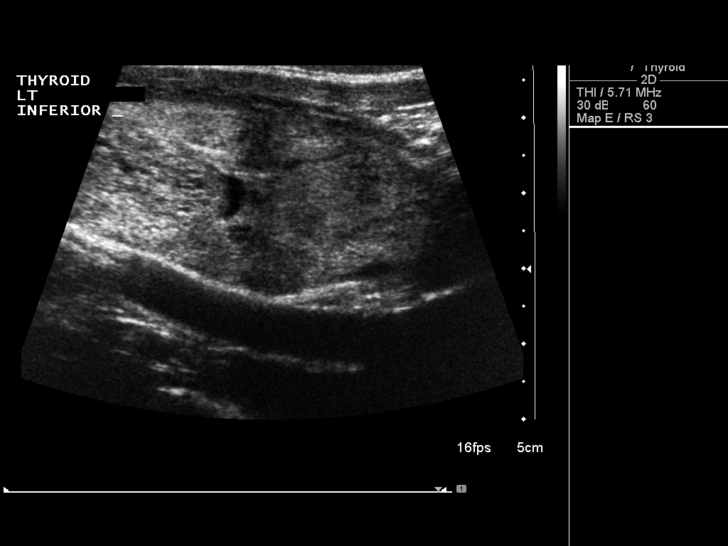
[im 4/12]
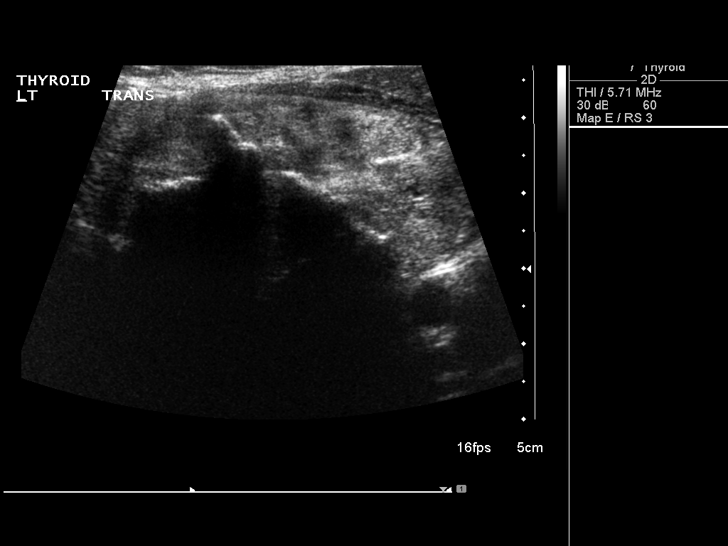
[im 5/12]
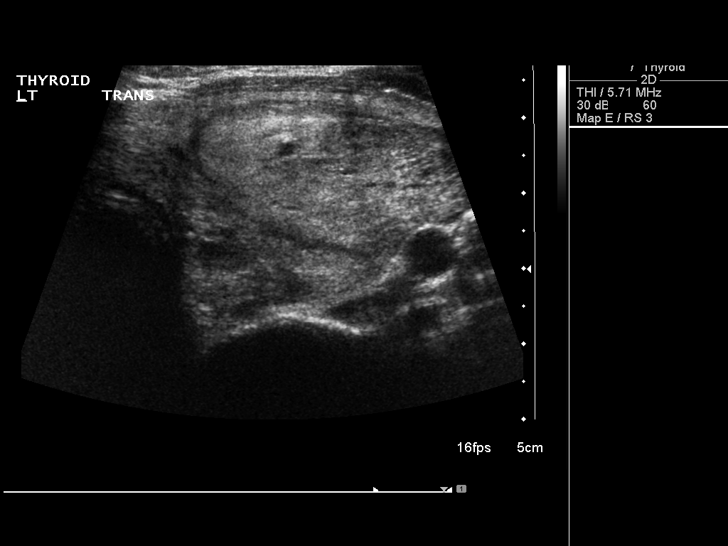
[im 6/12]
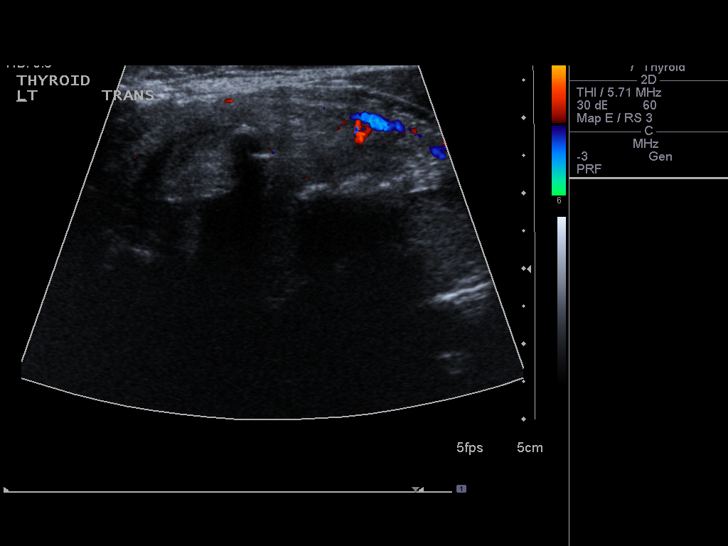
[im 7/12]
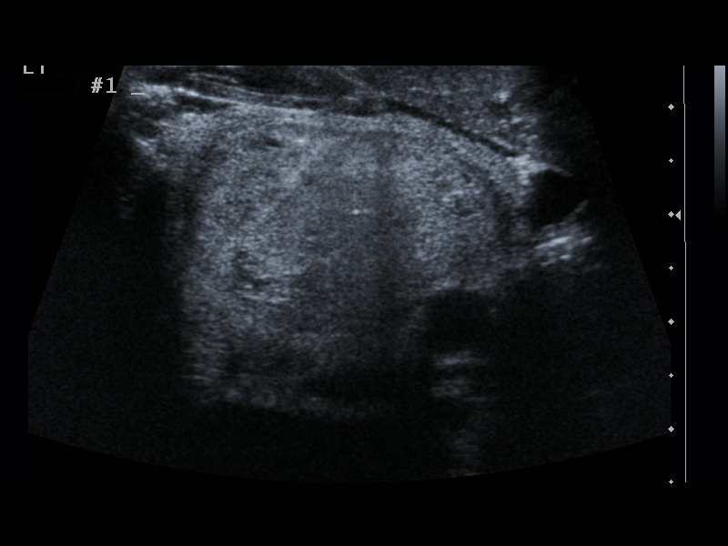
[im 8/12]
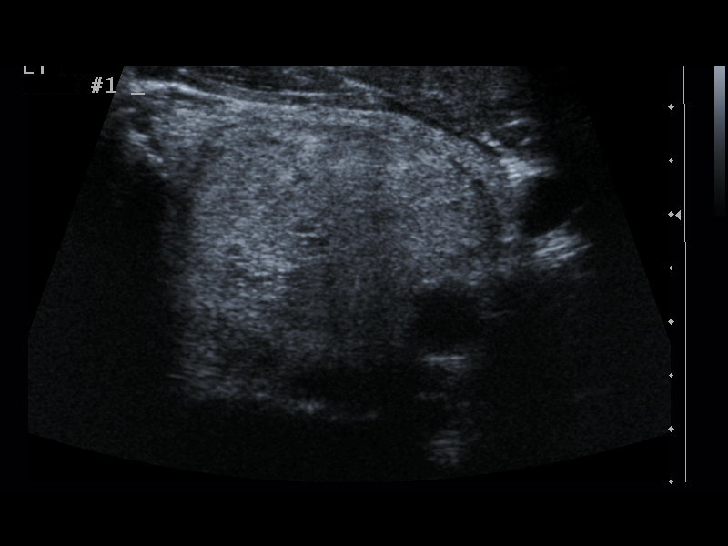
[im 9/12]
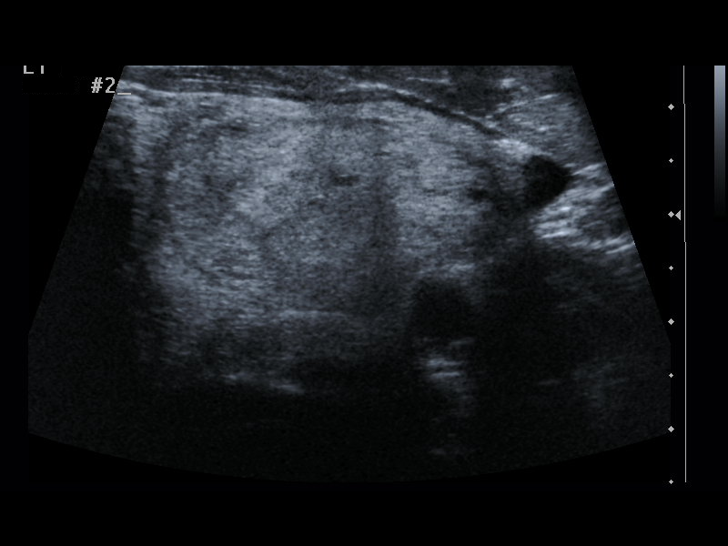
[im 10/12]
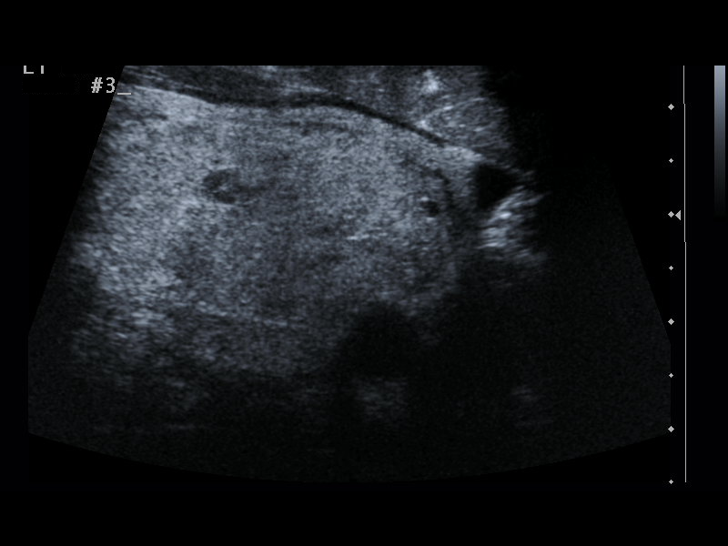
[im 11/12]
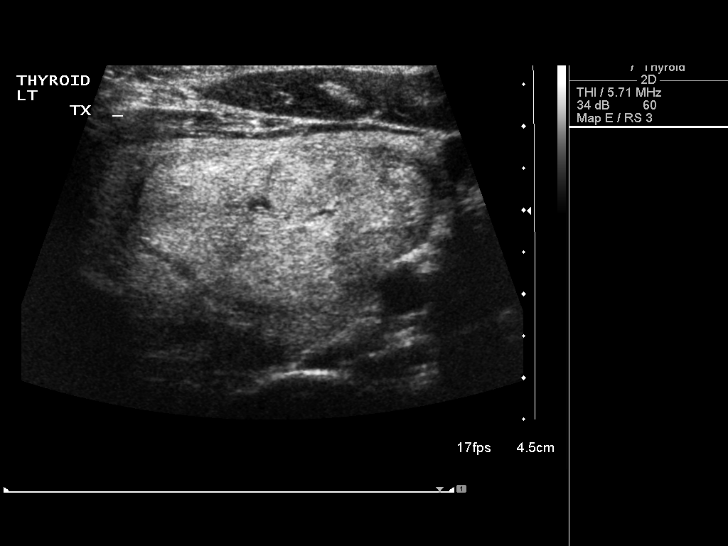
[im 12/12]
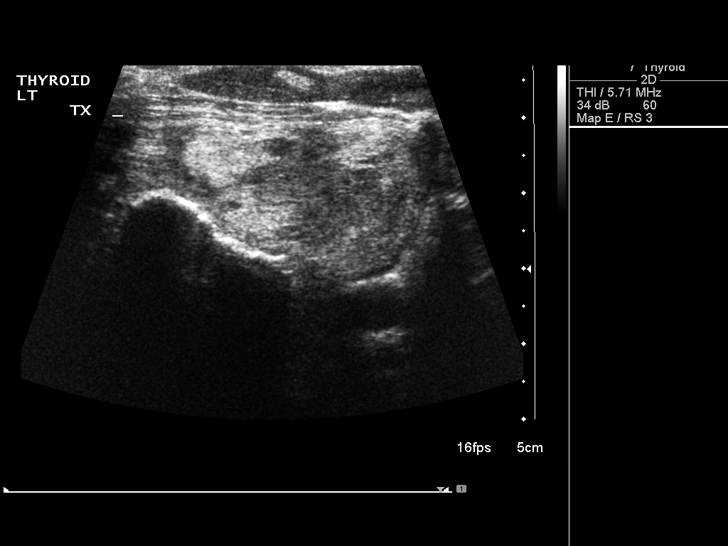

[12 of 12 positions shown; findings below may reference images not displayed]

FINDINGS: The images document guide needle placement within the
large left thyroid mass. Post biopsy images demonstrate no
hemorrhage.
IMPRESSION: Successful ultrasound-guided fine needle aspiration biopsy of a
large left thyroid mass.

## 2014-01-17 ENCOUNTER — Encounter (HOSPITAL_COMMUNITY): Payer: Self-pay | Admitting: Emergency Medicine

## 2014-08-12 ENCOUNTER — Inpatient Hospital Stay (HOSPITAL_COMMUNITY)
Admission: EM | Admit: 2014-08-12 | Discharge: 2014-08-12 | Disposition: A | Discharge status: Home / Self Care | Payer: Medicaid Other | Source: Ambulatory Visit | Attending: Family Medicine | Admitting: Family Medicine

## 2014-08-12 ENCOUNTER — Encounter (HOSPITAL_COMMUNITY): Payer: Self-pay | Admitting: *Deleted

## 2014-08-12 DIAGNOSIS — N939 Abnormal uterine and vaginal bleeding, unspecified: Secondary | ICD-10-CM

## 2014-08-12 HISTORY — DX: Calculus of gallbladder without cholecystitis without obstruction: K80.20

## 2014-08-12 LAB — URINE MICROSCOPIC-ADD ON

## 2014-08-12 LAB — URINALYSIS, ROUTINE W REFLEX MICROSCOPIC
BILIRUBIN URINE: NEGATIVE
GLUCOSE, UA: NEGATIVE mg/dL
Ketones, ur: NEGATIVE mg/dL
LEUKOCYTES UA: NEGATIVE
NITRITE: NEGATIVE
Protein, ur: NEGATIVE mg/dL
UROBILINOGEN UA: 0.2 mg/dL (ref 0.0–1.0)
pH: 6 (ref 5.0–8.0)

## 2014-08-12 LAB — POCT PREGNANCY, URINE: PREG TEST UR: NEGATIVE

## 2014-08-12 LAB — WET PREP, GENITAL
Clue Cells Wet Prep HPF POC: NONE SEEN
Trich, Wet Prep: NONE SEEN
YEAST WET PREP: NONE SEEN

## 2014-08-12 LAB — HCG, QUANTITATIVE, PREGNANCY: hCG, Beta Chain, Quant, S: 1 m[IU]/mL (ref <5)

## 2014-08-12 MED ORDER — NORGESTIMATE-ETH ESTRADIOL 0.25-35 MG-MCG PO TABS
1.0000 | ORAL_TABLET | Freq: Every day | ORAL | Status: DC
Start: 2014-08-12 — End: 2015-07-11

## 2014-08-12 NOTE — MAU Provider Note (Signed)
History     CSN: [id number]  Arrival date and time: 08/12/14 1306   First Provider Initiated Contact with Patient 08/12/14 1436      Chief Complaint  Patient presents with  . Vaginal Bleeding   HPI  Ms. Diana Velasquez is a [id number] here with report of irregular menstrual cycle and concern for missed pregnancy.  Pt reports having a normal Patient's last menstrual period was 03/22/2014 (approximate). No cycle in February.  +light bleeding in March bled approximately 2 days.  Negative bleeding in April with a positive pregnancy test.  Began bleeding three days ago, but the bleeding is described as light.  Pt desires to have oral contraception.  Last intercourse was three weeks ago.    Past Medical History  Diagnosis Date  . Gallstones     Past Surgical History  Procedure Laterality Date  . No past surgeries    . Cholecystectomy N/A 04/28/2012    Procedure: LAPAROSCOPIC CHOLECYSTECTOMY WITH INTRAOPERATIVE CHOLANGIOGRAM;  Surgeon: Kandis Cockingavid H Newman, MD;  [facility];  Service: General;  Laterality: N/A;    History reviewed. No pertinent family history.  History  Substance Use Topics  . Smoking status: Never Smoker   . Smokeless tobacco: Current User    Types: Chew  . Alcohol Use: No    Allergies: No Known Allergies  No prescriptions prior to admission    Review of Systems  Gastrointestinal: Negative for nausea, vomiting and abdominal pain.  Genitourinary:       Vaginal bleeding  All other systems reviewed and are negative.  All HPI/ROS/Medical HX conveyed via language line interpreter [phone] Physical Exam   Blood pressure 128/73, pulse 74, temperature 98.3 F (36.8 C), temperature source Oral, resp. rate 16, last menstrual period 03/22/2014.  Physical Exam  Constitutional: She is oriented to person, place, and time. She appears well-developed and well-nourished. No distress.  HENT:  Head: Normocephalic.  Neck: Normal range of motion. Neck supple.  Cardiovascular:  Normal rate, regular rhythm and normal heart sounds.   Respiratory: Effort normal and breath sounds normal. No respiratory distress.  GI: Soft. There is no tenderness.  Genitourinary: No bleeding (scant mucusy clear discharge; scant bleeding) in the vagina.  Musculoskeletal: Normal range of motion. She exhibits no edema.  Neurological: She is alert and oriented to person, place, and time. She has normal reflexes.  Skin: Skin is warm and dry.    MAU Course  Procedures Results for orders placed or performed during the hospital encounter of 08/12/14 (from the past 24 hour(s))  Urinalysis, Routine w reflex microscopic (not at [facility])     Status: Abnormal   Collection Time: 08/12/14  1:50 PM  Result Value Ref Range   Color, Urine YELLOW YELLOW   APPearance CLEAR CLEAR   Specific Gravity, Urine >1.030 (H) 1.005 - 1.030   pH 6.0 5.0 - 8.0   Glucose, UA NEGATIVE NEGATIVE mg/dL   Hgb urine dipstick MODERATE (A) NEGATIVE   Bilirubin Urine NEGATIVE NEGATIVE   Ketones, ur NEGATIVE NEGATIVE mg/dL   Protein, ur NEGATIVE NEGATIVE mg/dL   Urobilinogen, UA 0.2 0.0 - 1.0 mg/dL   Nitrite NEGATIVE NEGATIVE   Leukocytes, UA NEGATIVE NEGATIVE  Urine microscopic-add on     Status: None   Collection Time: 08/12/14  1:50 PM  Result Value Ref Range   Squamous Epithelial / LPF RARE RARE   RBC / HPF 0-2 <3 RBC/hpf  Pregnancy, urine POC     Status: None   Collection Time: 08/12/14  1:58 PM  Result Value Ref Range   Preg Test, Ur NEGATIVE NEGATIVE  hCG, quantitative, pregnancy     Status: None   Collection Time: 08/12/14  2:49 PM  Result Value Ref Range   hCG, Beta Chain, Quant, S 1 <5 mIU/mL     Assessment and Plan  Abnormal Uterine Bleeding  Plan: Discharge to home RX Sprintec Explained to begin on Sunday and to take one pill at the same time every day.  Explained that refills are available. Follow-up if no improvement or worsening of symptoms.  All discharge information conveyed via language  line interpreter [phone].  Eino Farber Kennith Gain, CNM

## 2014-08-12 NOTE — Discharge Instructions (Signed)
Abnormal Uterine Bleeding Abnormal uterine bleeding can affect women at various stages in life, including teenagers, women in their reproductive years, pregnant women, and women who have reached menopause. Several kinds of uterine bleeding are considered abnormal, including:  Bleeding or spotting between periods.   Bleeding after sexual intercourse.   Bleeding that is heavier or more than normal.   Periods that last longer than usual.  Bleeding after menopause.  Many cases of abnormal uterine bleeding are minor and simple to treat, while others are more serious. Any type of abnormal bleeding should be evaluated by your health care provider. Treatment will depend on the cause of the bleeding. HOME CARE INSTRUCTIONS Monitor your condition for any changes. The following actions may help to alleviate any discomfort you are experiencing:  Avoid the use of tampons and douches as directed by your health care provider.  Change your pads frequently. You should get regular pelvic exams and Pap tests. Keep all follow-up appointments for diagnostic tests as directed by your health care provider.  SEEK MEDICAL CARE IF:   Your bleeding lasts more than 1 week.   You feel dizzy at times.  SEEK IMMEDIATE MEDICAL CARE IF:   You pass out.   You are changing pads every 15 to 30 minutes.   You have abdominal pain.  You have a fever.   You become sweaty or weak.   You are passing large blood clots from the vagina.   You start to feel nauseous and vomit. MAKE SURE YOU:   Understand these instructions.  Will watch your condition.  Will get help right away if you are not doing well or get worse. Document Released: 03/04/2005 Document Revised: 03/09/2013 Document Reviewed: 10/01/2012 ExitCare Patient Information 2015 [employer]. This information is not intended to replace advice given to you by your health care provider. Make sure you discuss any questions you have with your  health care provider.  

## 2014-08-12 NOTE — MAU Note (Signed)
Pos HPT in March, then had negative HPT in April & May.  Last period in Feb.  Pt not sure if pregnant but feels like her abdomen is growing.  Denies pain but C/O blood in her urine, denies pain or burning with urination.

## 2014-08-16 LAB — GC/CHLAMYDIA PROBE AMP (~~LOC~~) NOT AT ARMC
Chlamydia: NEGATIVE
Neisseria Gonorrhea: NEGATIVE

## 2014-09-29 IMAGING — CR DG HAND COMPLETE 3+V*R*
3 series · 3 of 3 positions shown · non-contrast
Comparison: None.

CLINICAL DATA: Fall

EXAM:
RIGHT HAND - COMPLETE 3+ VIEW

[x hand pa right]
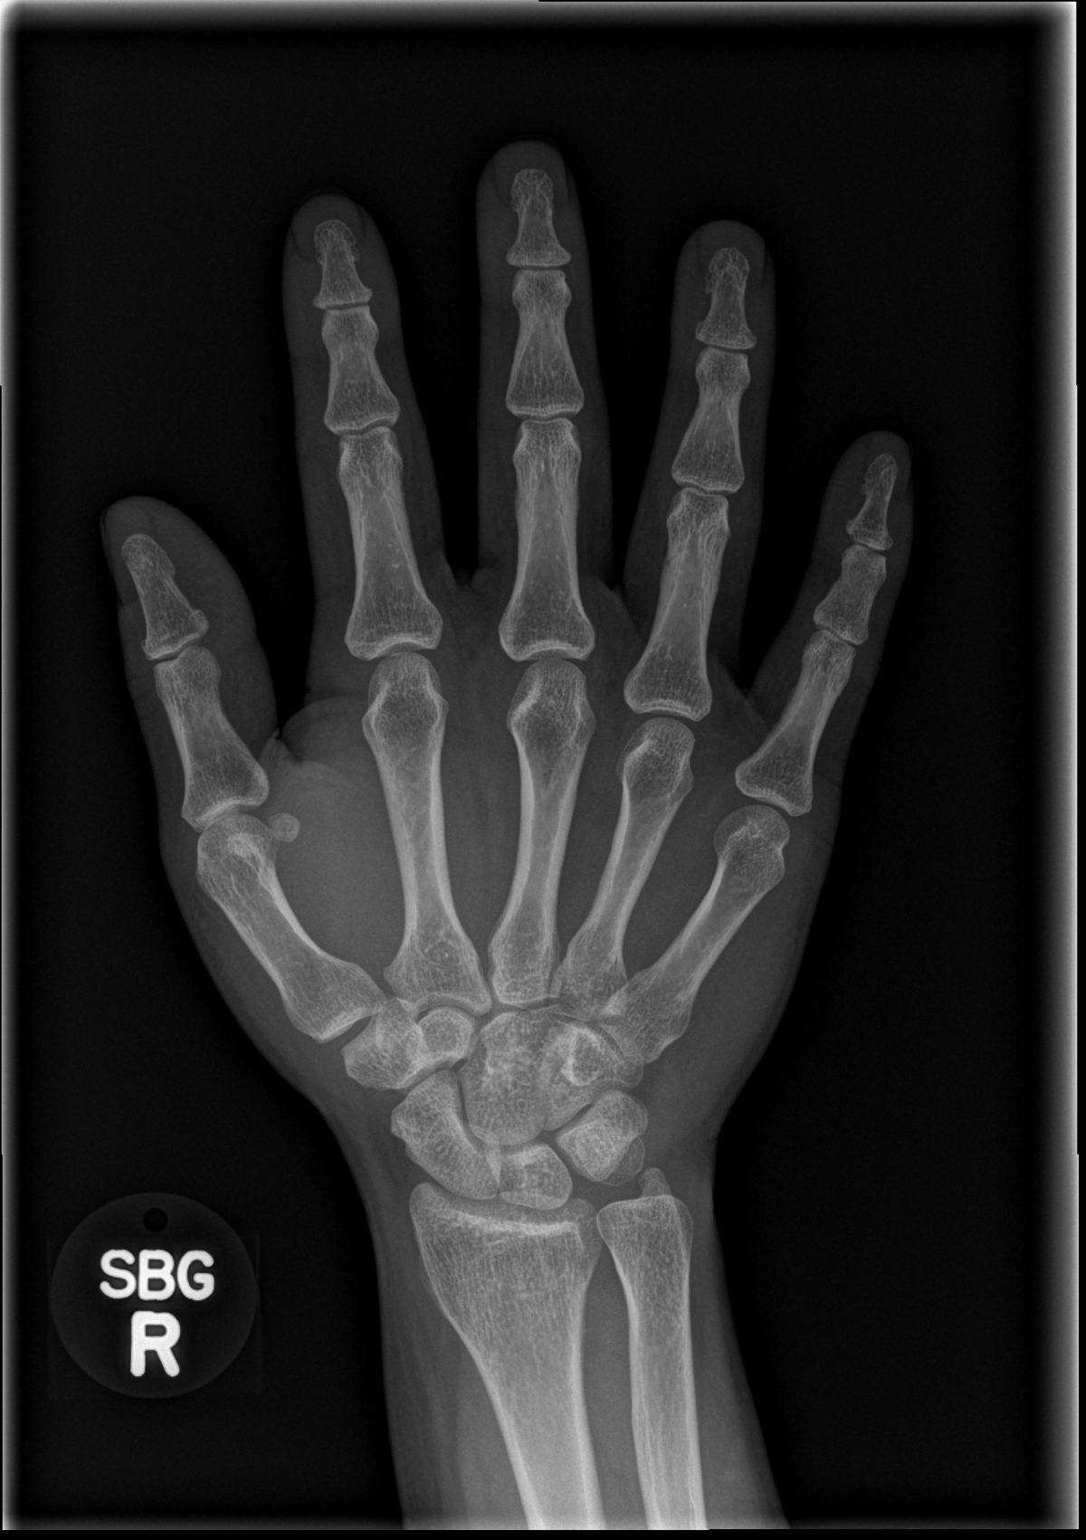

[x hand obl right]
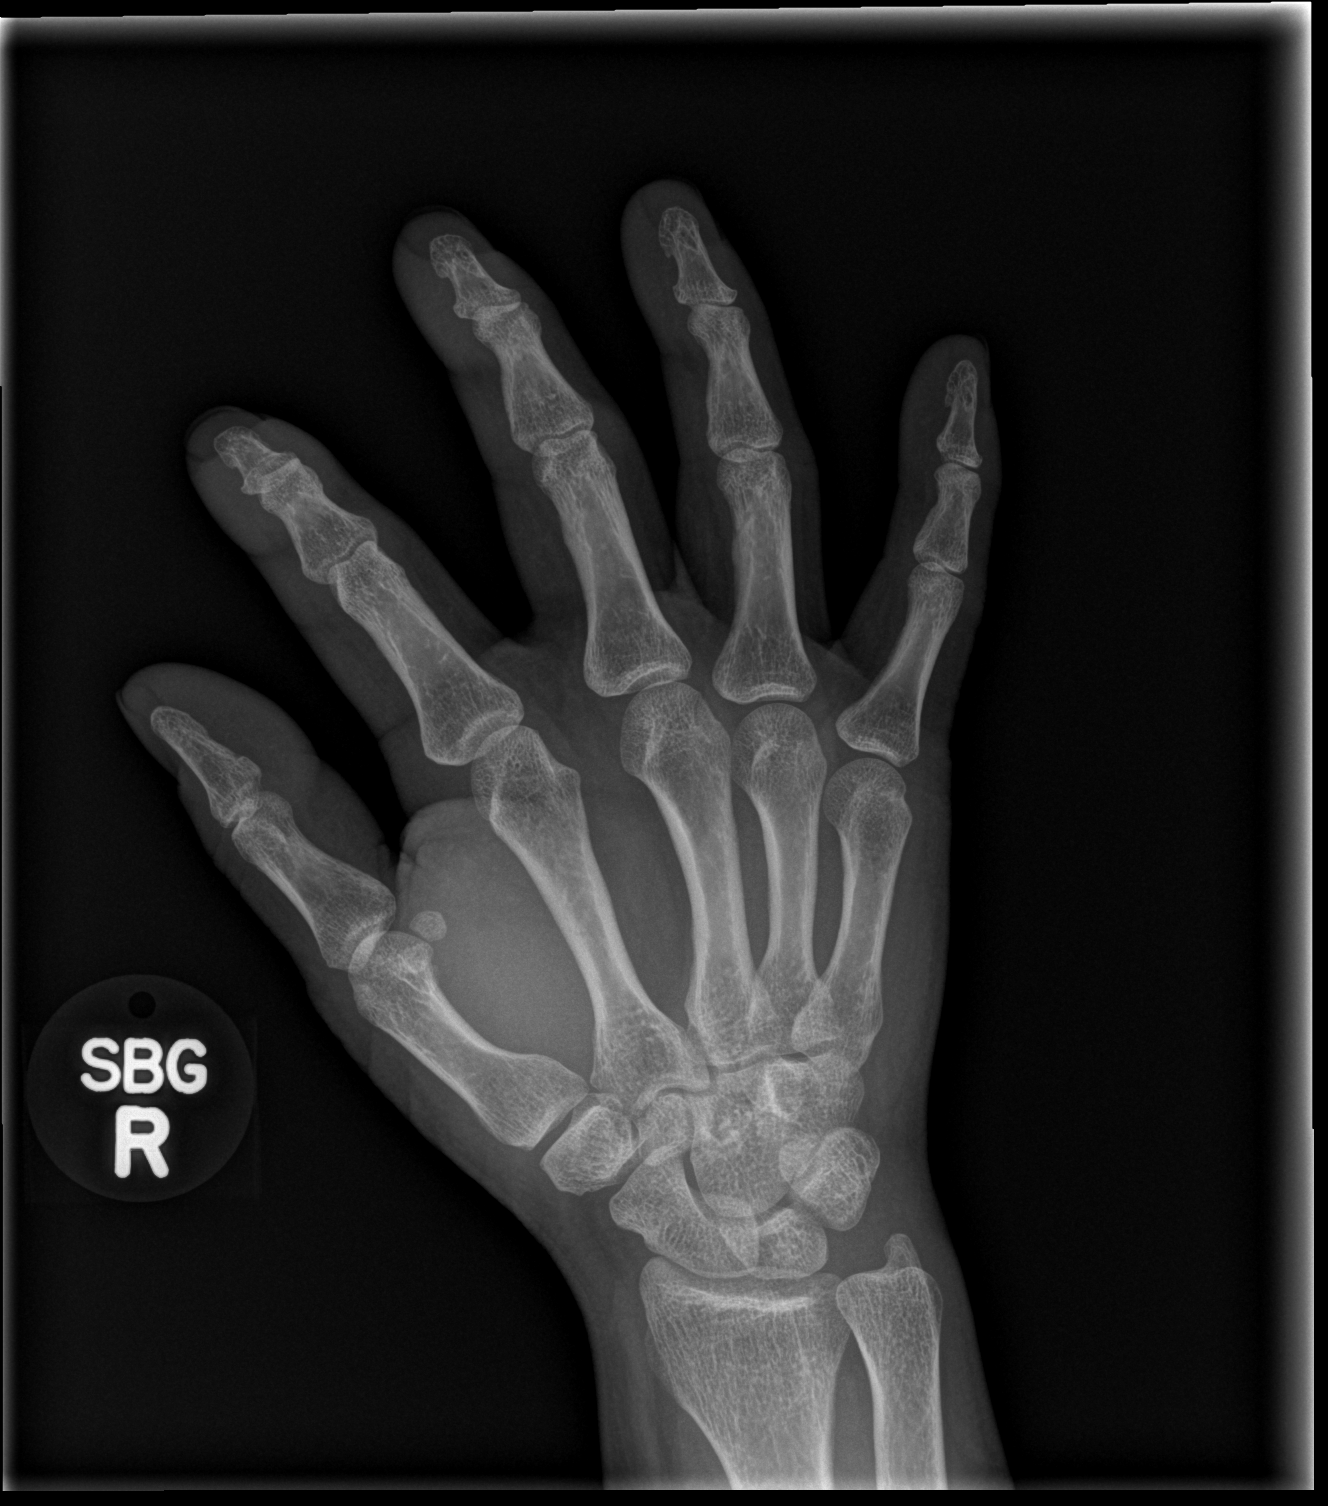

[x hand lat right]
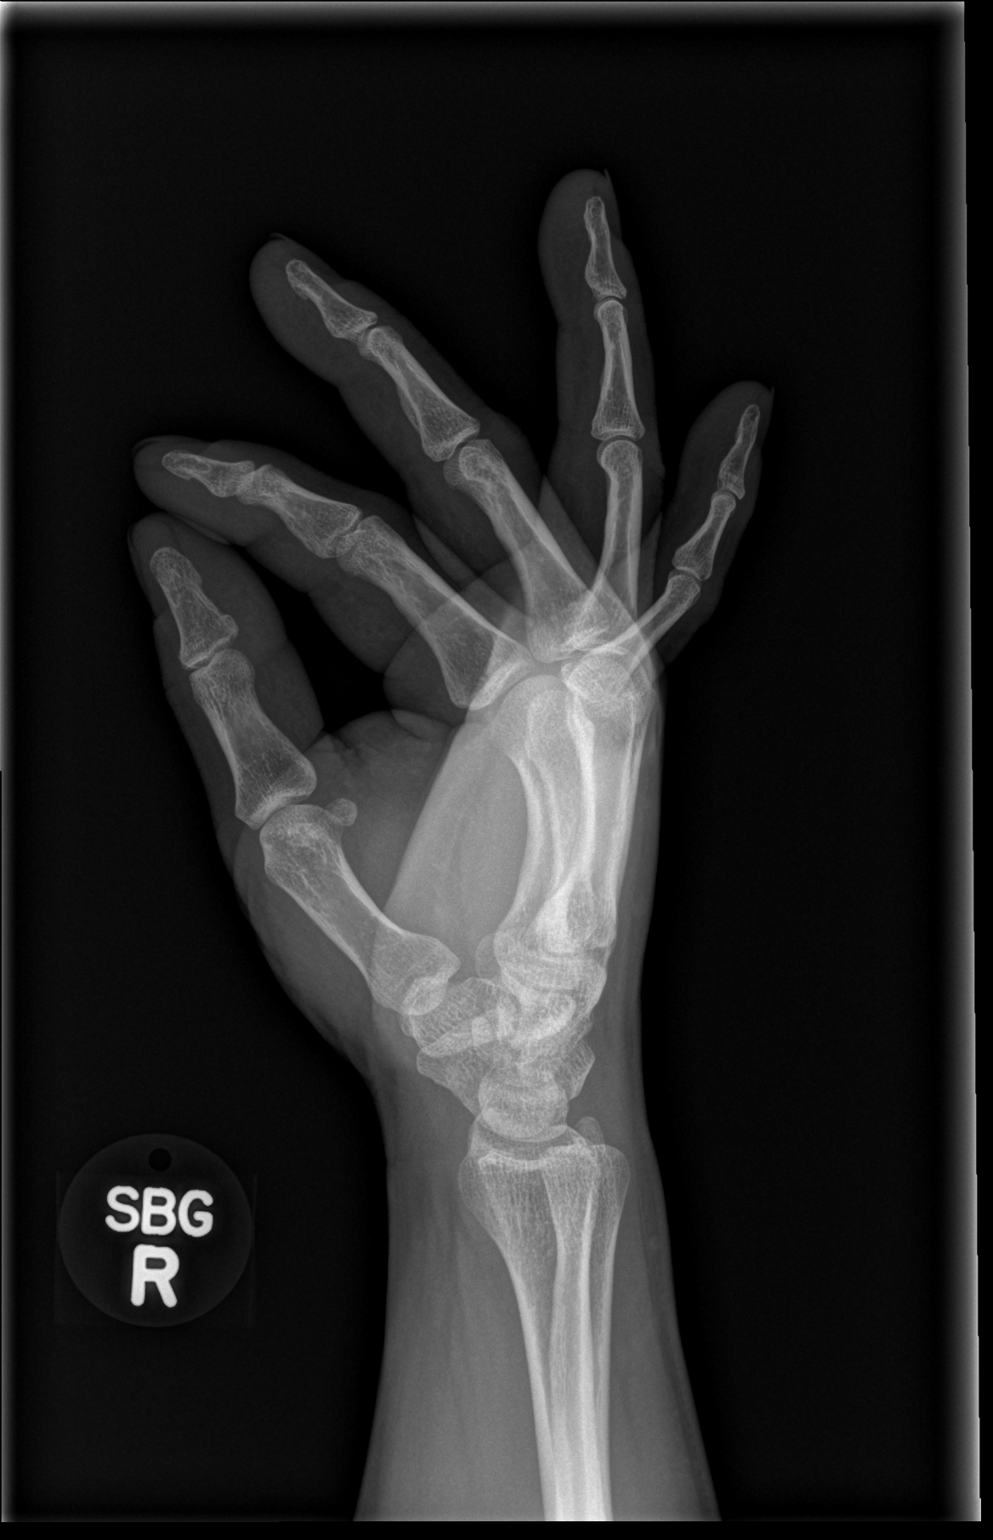

[3 of 3 positions shown; findings below may reference images not displayed]

FINDINGS: No acute fracture.  No dislocation.  Unremarkable soft tissues.
IMPRESSION: No acute bony pathology.

## 2015-07-07 ENCOUNTER — Encounter (HOSPITAL_COMMUNITY): Payer: Self-pay | Admitting: *Deleted

## 2015-07-07 ENCOUNTER — Emergency Department (HOSPITAL_COMMUNITY)
Admission: EM | Admit: 2015-07-07 | Discharge: 2015-07-11 | Disposition: A | Discharge status: Home / Self Care | Payer: BLUE CROSS/BLUE SHIELD | Attending: Emergency Medicine | Admitting: Emergency Medicine

## 2015-07-07 DIAGNOSIS — F919 Conduct disorder, unspecified: Secondary | ICD-10-CM | POA: Insufficient documentation

## 2015-07-07 DIAGNOSIS — Z3202 Encounter for pregnancy test, result negative: Secondary | ICD-10-CM | POA: Insufficient documentation

## 2015-07-07 DIAGNOSIS — F22 Delusional disorders: Secondary | ICD-10-CM | POA: Insufficient documentation

## 2015-07-07 DIAGNOSIS — R4689 Other symptoms and signs involving appearance and behavior: Secondary | ICD-10-CM

## 2015-07-07 DIAGNOSIS — IMO0002 Reserved for concepts with insufficient information to code with codable children: Secondary | ICD-10-CM

## 2015-07-07 DIAGNOSIS — Z793 Long term (current) use of hormonal contraceptives: Secondary | ICD-10-CM | POA: Insufficient documentation

## 2015-07-07 DIAGNOSIS — F10230 Alcohol dependence with withdrawal, uncomplicated: Secondary | ICD-10-CM | POA: Diagnosis present

## 2015-07-07 DIAGNOSIS — F1094 Alcohol use, unspecified with alcohol-induced mood disorder: Secondary | ICD-10-CM | POA: Diagnosis present

## 2015-07-07 DIAGNOSIS — F10920 Alcohol use, unspecified with intoxication, uncomplicated: Secondary | ICD-10-CM

## 2015-07-07 DIAGNOSIS — E876 Hypokalemia: Secondary | ICD-10-CM | POA: Insufficient documentation

## 2015-07-07 DIAGNOSIS — F10120 Alcohol abuse with intoxication, uncomplicated: Secondary | ICD-10-CM | POA: Insufficient documentation

## 2015-07-07 DIAGNOSIS — Z8719 Personal history of other diseases of the digestive system: Secondary | ICD-10-CM | POA: Insufficient documentation

## 2015-07-07 LAB — COMPREHENSIVE METABOLIC PANEL
ALBUMIN: 4.3 g/dL (ref 3.5–5.0)
ALK PHOS: 78 U/L (ref 38–126)
ALT: 25 U/L (ref 14–54)
AST: 39 U/L (ref 15–41)
Anion gap: 14 (ref 5–15)
BILIRUBIN TOTAL: 0.9 mg/dL (ref 0.3–1.2)
BUN: 14 mg/dL (ref 6–20)
CALCIUM: 8.6 mg/dL — AB (ref 8.9–10.3)
CO2: 22 mmol/L (ref 22–32)
CREATININE: 0.81 mg/dL (ref 0.44–1.00)
Chloride: 110 mmol/L (ref 101–111)
GFR calc Af Amer: >60 mL/min (ref >60)
GFR calc non Af Amer: >60 mL/min (ref >60)
GLUCOSE: 107 mg/dL — AB (ref 65–99)
Potassium: 2.9 mmol/L — ABNORMAL LOW (ref 3.5–5.1)
SODIUM: 146 mmol/L — AB (ref 135–145)
TOTAL PROTEIN: 7.8 g/dL (ref 6.5–8.1)

## 2015-07-07 LAB — CBC WITH DIFFERENTIAL/PLATELET
BASOS PCT: 1 %
Basophils Absolute: 0.1 10*3/uL (ref 0.0–0.1)
EOS ABS: 0.1 10*3/uL (ref 0.0–0.7)
EOS PCT: 1 %
HCT: 39.3 % (ref 36.0–46.0)
HEMOGLOBIN: 13.9 g/dL (ref 12.0–15.0)
LYMPHS ABS: 2.1 10*3/uL (ref 0.7–4.0)
Lymphocytes Relative: 26 %
MCH: 33.5 pg (ref 26.0–34.0)
MCHC: 35.4 g/dL (ref 30.0–36.0)
MCV: 94.7 fL (ref 78.0–100.0)
MONO ABS: 0.5 10*3/uL (ref 0.1–1.0)
MONOS PCT: 6 %
Neutro Abs: 5.3 10*3/uL (ref 1.7–7.7)
Neutrophils Relative %: 66 %
PLATELETS: 300 10*3/uL (ref 150–400)
RBC: 4.15 MIL/uL (ref 3.87–5.11)
RDW: 12.5 % (ref 11.5–15.5)
WBC: 8.1 10*3/uL (ref 4.0–10.5)

## 2015-07-07 LAB — ETHANOL: Alcohol, Ethyl (B): 490 mg/dL (ref <5)

## 2015-07-07 LAB — ACETAMINOPHEN LEVEL: Acetaminophen (Tylenol), Serum: <10 ug/mL — ABNORMAL LOW (ref 10–30)

## 2015-07-07 LAB — SALICYLATE LEVEL

## 2015-07-07 MED ORDER — ZIPRASIDONE MESYLATE 20 MG IM SOLR
10.0000 mg | Freq: Once | INTRAMUSCULAR | Status: AC
  Administered 2015-07-07: 10 mg via INTRAMUSCULAR
  Filled 2015-07-07: qty 20
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
## 2015-07-07 NOTE — Progress Notes (Signed)
Attempted to speak with pt who was noted to be sitting on her knees at the end of stretcher with side rails up x 2 ED NTs x 2 came to assist pt to restroom  Pt does not speak english

## 2015-07-07 NOTE — ED Provider Notes (Signed)
4:40 PM patient states she does not know why she is here. She is barely been drinking alcohol earlier today she offers no specific complaint.  Doug SouSam Sharlie Shreffler, MD 07/07/15 [phone]

## 2015-07-07 NOTE — ED Notes (Signed)
Pt tried to get up from hall bed saying "bathroom", myself and one more NT attempted to transfer her on the steady. Once in bathroom pt refused to sit on toilet. Wall-E was brought in, after asking interpreter to tell her she could use the bathroom, interpreter stated "she is using very bad language with me", after a second attempt interpreter stated she continued to use bad language. Pt placed back in hall bed.

## 2015-07-07 NOTE — ED Notes (Signed)
No psych assessment.  Patient is too intoxicated.

## 2015-07-07 NOTE — ED Notes (Signed)
[facility]

## 2015-07-07 NOTE — ED Notes (Signed)
[facility] Expected date:  Expected time:  Means of arrival:  Comments: Hold for triage 2

## 2015-07-07 NOTE — ED Notes (Signed)
Pt's contact list: Oldest son Patrcia DollyMoses [phone],  interpreter Delman KittenMaung [phone] (per case manager it would more beneficial for pt to use different Burmese female interpreter);  CPS case worker Maple HudsonJerry Ufot [phone], cell [phone] Arther AbbottLizzy Biddle Case Manager [phone] [facility] PtrVickie Plott [phone]

## 2015-07-08 DIAGNOSIS — F10239 Alcohol dependence with withdrawal, unspecified: Secondary | ICD-10-CM

## 2015-07-08 DIAGNOSIS — F1094 Alcohol use, unspecified with alcohol-induced mood disorder: Secondary | ICD-10-CM | POA: Diagnosis not present

## 2015-07-08 DIAGNOSIS — F10230 Alcohol dependence with withdrawal, uncomplicated: Secondary | ICD-10-CM | POA: Diagnosis present

## 2015-07-08 DIAGNOSIS — R45851 Suicidal ideations: Secondary | ICD-10-CM | POA: Diagnosis not present

## 2015-07-08 DIAGNOSIS — F10929 Alcohol use, unspecified with intoxication, unspecified: Secondary | ICD-10-CM | POA: Insufficient documentation

## 2015-07-08 LAB — BASIC METABOLIC PANEL
ANION GAP: 8 (ref 5–15)
Anion gap: 13 (ref 5–15)
BUN: 11 mg/dL (ref 6–20)
BUN: 8 mg/dL (ref 6–20)
CALCIUM: 9.2 mg/dL (ref 8.9–10.3)
CHLORIDE: 103 mmol/L (ref 101–111)
CO2: 21 mmol/L — AB (ref 22–32)
CO2: 24 mmol/L (ref 22–32)
CREATININE: 0.63 mg/dL (ref 0.44–1.00)
CREATININE: 0.75 mg/dL (ref 0.44–1.00)
Calcium: 8.4 mg/dL — ABNORMAL LOW (ref 8.9–10.3)
Chloride: 108 mmol/L (ref 101–111)
GFR calc Af Amer: >60 mL/min (ref >60)
GFR calc non Af Amer: >60 mL/min (ref >60)
GLUCOSE: 124 mg/dL — AB (ref 65–99)
Glucose, Bld: 186 mg/dL — ABNORMAL HIGH (ref 65–99)
POTASSIUM: 3.8 mmol/L (ref 3.5–5.1)
Potassium: 4.8 mmol/L (ref 3.5–5.1)
SODIUM: 137 mmol/L (ref 135–145)
Sodium: 140 mmol/L (ref 135–145)

## 2015-07-08 LAB — RAPID URINE DRUG SCREEN, HOSP PERFORMED
Amphetamines: NOT DETECTED
BARBITURATES: NOT DETECTED
BENZODIAZEPINES: NOT DETECTED
COCAINE: NOT DETECTED
OPIATES: NOT DETECTED
Tetrahydrocannabinol: NOT DETECTED

## 2015-07-08 LAB — TROPONIN I: Troponin I: <0.03 ng/mL (ref <0.031)

## 2015-07-08 LAB — URINALYSIS, ROUTINE W REFLEX MICROSCOPIC
Bilirubin Urine: NEGATIVE
GLUCOSE, UA: NEGATIVE mg/dL
Ketones, ur: 15 mg/dL — AB
LEUKOCYTES UA: NEGATIVE
Nitrite: NEGATIVE
PROTEIN: NEGATIVE mg/dL
SPECIFIC GRAVITY, URINE: 1.025 (ref 1.005–1.030)
pH: 6 (ref 5.0–8.0)

## 2015-07-08 LAB — POC URINE PREG, ED: PREG TEST UR: NEGATIVE

## 2015-07-08 LAB — URINE MICROSCOPIC-ADD ON
Squamous Epithelial / LPF: NONE SEEN
WBC, UA: NONE SEEN WBC/hpf (ref 0–5)

## 2015-07-08 MED ORDER — THIAMINE HCL 100 MG/ML IJ SOLN: 100.0000 mg | Freq: Every day | INTRAMUSCULAR | Status: DC

## 2015-07-08 MED ORDER — ZOLPIDEM TARTRATE 5 MG PO TABS
5.0000 mg | ORAL_TABLET | Freq: Every evening | ORAL | Status: DC | PRN
  Administered 2015-07-09: 5 mg via ORAL
  Filled 2015-07-08 (×2): qty 1

## 2015-07-08 MED ORDER — IBUPROFEN 200 MG PO TABS: 600.0000 mg | ORAL_TABLET | Freq: Three times a day (TID) | ORAL | Status: DC | PRN

## 2015-07-08 MED ORDER — ONDANSETRON HCL 4 MG PO TABS
4.0000 mg | ORAL_TABLET | Freq: Three times a day (TID) | ORAL | Status: DC | PRN
Start: 2015-07-08 — End: 2015-07-11

## 2015-07-08 MED ORDER — POTASSIUM CHLORIDE IN NACL 20-0.9 MEQ/L-% IV SOLN
Freq: Once | INTRAVENOUS | Status: AC
  Administered 2015-07-08: 10:00:00 via INTRAVENOUS
  Filled 2015-07-08: qty 1000

## 2015-07-08 MED ORDER — ALUM & MAG HYDROXIDE-SIMETH 200-200-20 MG/5ML PO SUSP: 30.0000 mL | ORAL | Status: DC | PRN

## 2015-07-08 MED ORDER — VITAMIN B-1 100 MG PO TABS
100.0000 mg | ORAL_TABLET | Freq: Every day | ORAL | Status: DC
  Administered 2015-07-08 – 2015-07-11 (×4): 100 mg via ORAL
  Filled 2015-07-08 (×4): qty 1

## 2015-07-08 MED ORDER — VITAMIN B-1 100 MG PO TABS: 100.0000 mg | ORAL_TABLET | Freq: Every day | ORAL | Status: DC

## 2015-07-08 MED ORDER — THIAMINE HCL 100 MG/ML IJ SOLN
Freq: Once | INTRAVENOUS | Status: AC
  Administered 2015-07-08: 09:00:00 via INTRAVENOUS
  Filled 2015-07-08: qty 1000

## 2015-07-08 MED ORDER — ACETAMINOPHEN 325 MG PO TABS: 650.0000 mg | ORAL_TABLET | ORAL | Status: DC | PRN

## 2015-07-08 MED ORDER — POTASSIUM CHLORIDE 20 MEQ/15ML (10%) PO SOLN
40.0000 meq | ORAL | Status: AC
  Administered 2015-07-08 (×3): 40 meq via ORAL
  Filled 2015-07-08 (×4): qty 30

## 2015-07-08 MED ORDER — LORAZEPAM 1 MG PO TABS
0.0000 mg | ORAL_TABLET | Freq: Two times a day (BID) | ORAL | Status: AC
  Administered 2015-07-09 – 2015-07-10 (×2): 1 mg via ORAL
  Administered 2015-07-10: 2 mg via ORAL
  Administered 2015-07-11: 1 mg via ORAL
  Filled 2015-07-08 (×2): qty 1
  Filled 2015-07-08: qty 2

## 2015-07-08 MED ORDER — LORAZEPAM 1 MG PO TABS
1.0000 mg | ORAL_TABLET | Freq: Three times a day (TID) | ORAL | Status: DC | PRN
  Administered 2015-07-09: 1 mg via ORAL
  Filled 2015-07-08 (×2): qty 1

## 2015-07-08 MED ORDER — LORAZEPAM 1 MG PO TABS
0.0000 mg | ORAL_TABLET | Freq: Four times a day (QID) | ORAL | Status: AC
  Administered 2015-07-08 (×3): 1 mg via ORAL
  Filled 2015-07-08 (×5): qty 1

## 2015-07-08 MED ORDER — POTASSIUM CHLORIDE CRYS ER 20 MEQ PO TBCR
40.0000 meq | EXTENDED_RELEASE_TABLET | Freq: Once | ORAL | Status: DC
  Filled 2015-07-08 (×2): qty 2

## 2015-07-08 MED ORDER — POTASSIUM CHLORIDE 20 MEQ/15ML (10%) PO SOLN
40.0000 meq | Freq: Once | ORAL | Status: DC
  Filled 2015-07-08 (×3): qty 30

## 2015-07-08 NOTE — ED Notes (Signed)
Attempted to speak with pt through interpreter machine. Pt states she can't answer questions right now because she drunk. Pt states she that being drunk is not new. She drinks $12 worth of alcohol everyday. Pt inquires about her son Patrcia DollyMoses that she states is almost 42 year old.

## 2015-07-08 NOTE — ED Provider Notes (Signed)
Patient was reassessed she has been very agitated and not cooperative.  She is given Geodon due to her agitation.  Patient will need TTS assessment when she sobers up further  Charlestine NightChristopher Valentin Benney, PA-C 07/08/15 0023  Doug SouSam Jacubowitz, MD 07/08/15 1123

## 2015-07-08 NOTE — ED Notes (Signed)
Patient is withdrawn and refuses to ans

## 2015-07-08 NOTE — ED Notes (Signed)
CASE MANAGER AT THE BEDSIDE. WILL CONTINUE TO MONITOR.

## 2015-07-08 NOTE — ED Provider Notes (Signed)
Patient has been observed for elevated alcohol level, apparently expressing paranoid thoughts. Screening labs showed potassium of 2.9. She is given aggressive oral potassium repletion. She is started on CIWA protocol. TTS consultation has been requested. Potassium level will be rechecked after several doses of oral potassium.  Diana Boozeavid Generoso Cropper, MD 07/08/15 [phone]

## 2015-07-08 NOTE — ED Notes (Signed)
Patient woke up wanting to see her son.  Advised her of the time of 4am.  NT offered assistance to the restroom.  Attempted to get urine sample but was unable to obtain.  Patient is now sitting in her bed.  Patient was offered water to drink.

## 2015-07-08 NOTE — Clinical Social Work Note (Signed)
CSW received call from RN stating that the pt's family was concerned that pt may want to leave and they were considering the IVC process.  CSW provided explanation of the process if psychiatry decides at the hospital and also provided explanation regarding the family obtaining the IVC from the magistrates office.  Pt is not medically cleared yet and psychiatry will review once medically cleared to decide if inpatient and IVC status should be followed.  Elray Buba.Nashla Althoff, LCSW [facility] Clinical Social Worker - Weekend Coverage cell #: [phone]
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
## 2015-07-08 NOTE — ED Notes (Signed)
Attempted to get patient to take her medications again.  Patient turns and ignores me and when offered the medications turns her head and states "no".

## 2015-07-08 NOTE — ED Provider Notes (Signed)
I was notified that the patient had complained of chest pain. EKG and troponin were added as well as an order for a banana bag as the patient has significant alcohol consumption. At signout, Dr. Preston FleetingGlick advised that the patient had some hypokalemia that was being treated.   I assessed the patient after notification by nurses of a complaint of chest pain. She was getting an EKG done at the time. She was alert and interactive. The patient speaks Burmese and there was significant language barrier. She is in no physical distress. She has good eye contact. By communication with a few words of English and gesturing, it appears that the patient wanted to have her brazier returned to her. She is denying pain. This is ascertained by gesturing to the chest and expressing pain to which she says no. When shown a brazier, she gestures yes and also points to a sweater she wants as well.  Heart sounds are regular with no rub murmur gallop. Lungs are clear without wheeze rhonchi rail. She expresses no pain to palpation of the abdomen and epigastrium. Her lower extremities do not have any peripheral edema and no expression of pain with compression of the calves.  Troponin is not elevated and EKG shows normal sinus rhythm without ST-T wave changes. The patient's potassium has normalized. At this time, patient is cleared to continue her treatment for alcohol intoxication and psychiatric assessment.  Arby BarretteMarcy Amina Menchaca, MD 07/08/15 1051

## 2015-07-08 NOTE — BH Assessment (Addendum)
Unable to complete TTS consult at this time due to interpreter machine being unavailable (no wi-fi connection). Burmese interpreter will be available at 8 am.

## 2015-07-08 NOTE — ED Notes (Addendum)
Patient advised that she will not take medication. Informed MD that patient refused.

## 2015-07-08 NOTE — ED Notes (Signed)
BURMESE LIASON COMMUNITY LEADER HERE TO SEE PT. PT CALM AND COOPERATIVE.

## 2015-07-08 NOTE — ED Notes (Signed)
PT C/O BILATERAL HAND STIFFNESS AND WEAKNESS. WHEN OPENING AND CLOSING HER HANDS. GRIPS EQUAL BILATERALLY.
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
## 2015-07-08 NOTE — ED Notes (Signed)
Have patient on the phone with interpreter.  Advised patient of potassium level and the importance of taking her medication.  She agreed to take her medicine.

## 2015-07-09 NOTE — ED Notes (Signed)
Talked to Amy RN charge nurse about the situation. Pt. Remains in the hall by the nurses station.

## 2015-07-09 NOTE — ED Notes (Signed)
Pt. Transferred to [facility] from ED to room 37. Report from Amy RN. Pt. Not willing to stay in room despite repeated attempts to assure her that she is safe. Interpreter Wendy, [phone] called and attempted to determine patients reluctance to stay in her room. Pt. States she is afraid to stay in [facility] because she is "alone". Pt. Told by interpreter that the staff make rounds every 15 minutes and that there are cameras in all the rooms to assure her safety. Pt. Still unwilling to go to her room and remains in hall by nurses station.

## 2015-07-09 NOTE — Progress Notes (Signed)
Disposition CSW completed patient referrals to the following inpatient psych facilities:  [facility]  [facility]  CSW will continue to follow patient for placement needs.  Seward SpeckLeo Kaleigha Chamberlin [facility] Institute[facility]W,[facility] Behavioral Health Disposition CSW [phone]

## 2015-07-09 NOTE — Clinical Social Work Note (Addendum)
CSW provided psychiatrist with IVC paperwork per his request.  CSW faxed to magistrates office.  CSW confirmed with magistrates office that they received the paperwork  .Elray Bubaegina Jann Milkovich, LCSW [facility] Clinical Social Worker - Weekend Coverage cell #: [phone]
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
## 2015-07-09 NOTE — ED Notes (Signed)
Pt. Transferred back to 33 after report with security.

## 2015-07-09 NOTE — ED Notes (Signed)
Pt ate 100% of her breakfast

## 2015-07-09 NOTE — ED Notes (Signed)
Pt. Remains in the hall unwilling to go to her room. Amy RN made aware of the situation. Pt. To be moved back to the TCU when sitter available.

## 2015-07-09 NOTE — ED Notes (Signed)
[id number] Expected date:  Expected time:  Means of arrival:  Comments: Rm 33

## 2015-07-10 DIAGNOSIS — F1094 Alcohol use, unspecified with alcohol-induced mood disorder: Secondary | ICD-10-CM | POA: Diagnosis not present

## 2015-07-10 DIAGNOSIS — R45851 Suicidal ideations: Secondary | ICD-10-CM | POA: Diagnosis not present
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
## 2015-07-10 NOTE — BH Assessment (Signed)
BHH Assessment Progress Note This Clinical research associatewriter spoke with patient this date with patient stating she "did not want to go home," when asked although patient's ability to speak English is limited. Patient did consent to speak with family members and gave this Clinical research associatewriter phone numbers she had written down. Collateral information was also gathered from chart where phone numbers where also obtained Gayla Doss(Husband, Patrcia DollyMoses [phone], Son [phone] and [phone] a family friend). All of these numbers where contacted and this writer spoke with the husband who informed this Clinical research associatewriter to contact his son at 18:00 hrs. The husband's ability to interact with this writer was limited due to his inability to speak [place]. Case was staffed with Melvyn NethLewis NP and patient will be held overnight and re-evaluated in the a.m. to determine disposition. This Clinical research associatewriter will contact patient's sons per father's request at 18:00 hrs to gather more information.

## 2015-07-10 NOTE — ED Notes (Signed)
Pt becoming agitated in room due to hearing another patient yell and scream. Pt states and is quiet adamant to see the patient. She states to staff that is her son. Pt reassured that her son was not in the room that the noises she heard were not from her son. She was asked to go back to her room several times as she refused to do so. Security was called for reinforcement. She has spit her night time medication out at the nurse that is attempting to help patient rest tonight.  Physician also notified to help patient deesculate.  Koren BoundWally- language barrier assistance was utilized to communicate with the patient. Pt was compliant and took medications.

## 2015-07-11 NOTE — Progress Notes (Signed)
CSW was informed by the Psychiatry Team to contact patient's case manager as patient had been cleared for discharge. CSW spoke with Diana Velasquez, Case Manager [facility]. CSW informed her that patient had been cleared for discharge and wanted to know if she would be picking up patient on today. She stated to call patient's husband to see if he would be able to pick up patient and he could not, then she would pick up patient.   CSW spoke with patient via interpreter machine, as patient speaks Burmese, along with the Nurse and Nurse Practitioner. Patient stated her husband was working and her son was in school. Patient stated she could ride the bus and she understood how to ride the bus system. Patient stated she was safe to go home and she had support from neighbors and family. Patient did note she would go home with her husband but that she was seeking her own apartment. CSW wrote down the [facility] and [facility] for patient to see a therapist if she chooses. Interpreter stated patient did understand the information given to [facility]. Patient stated she works at [employer] and she was going to discuss with her employer to see if her insurance would cover therapy services. CSW called Diana Velasquez back to inform her of this information. Message left with name and contact number for return call.   Diana Velasquez, LCSWA [phone] ED CSW 07/11/2015 4:08 PM
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
## 2015-07-11 NOTE — ED Notes (Signed)
Pt discharged to home via bus.  Pt verbalized understanding of discharge instructions per Interpreter.  All questions asked and answered.  Pt is A/O x4 and ambulatory.  No pain noted.  All belongings sent home with pt.  Sundra AlandMaura S Myles Mallicoat, RN

## 2015-07-11 NOTE — Discharge Instructions (Signed)
Please call and [facility].

## 2015-07-11 NOTE — Consult Note (Signed)
Psychiatric Specialty Exam: Physical Exam  ROS  Blood pressure 100/74, pulse 84, temperature 98 F (36.7 C), temperature source Oral, resp. rate 16, SpO2 100 %.There is no weight on file to calculate BMI.  General Appearance: Casual and Fairly Groomed  Eye Contact::  Good  Speech:  Clear and Coherent, Normal Rate and vis Burmanes interpreter  Volume:  Normal  Mood:  Anxious  Affect:  Congruent and Depressed  Thought Process:  Coherent, Goal Directed and Intact  Orientation:  Full (Time, Place, and Person)  Thought Content:  WDL  Suicidal Thoughts:  No  Homicidal Thoughts:  No  Memory:  Immediate;   Good Recent;   Good Remote;   Good  Judgement:  Good  Insight:  Good  Psychomotor Activity:  Normal  Concentration:  Good  Recall:  NA  Fund of Knowledge:  Good  Language:  Good  Akathisia:  No  Handed:  Right  AIMS (if indicated):     Assets:  Desire for Improvement Housing  ADL's:  Intact  Cognition:  WNL  Sleep:      Patient was seen in her room alert and oriented x 3. Evaluation was conducted via an interpreter.  Patient is ready to move back to her husband but plans to get her own place later.  She also admits to not drinking alcohol when she is angry to avoid hurting her Liver. Patient was [facility].  Patient denies SI/HI/AVH.  Patient is discharged home. Alcohol-induced mood disorder (HCC)   Plan:  Discharge home, [facility] if needed.  Diana ByesJosephine Velasquez   PMHNP-BC Patient seen face-to-face for psychiatric evaluation, chart reviewed and case discussed with the physician extender and developed treatment plan. Reviewed the information documented and agree with the treatment plan. Thedore MinsMojeed Ennio Houp, MD

## 2015-10-25 DIAGNOSIS — M79641 Pain in right hand: Secondary | ICD-10-CM
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
## 2015-10-30 ENCOUNTER — Ambulatory Visit (INDEPENDENT_AMBULATORY_CARE_PROVIDER_SITE_OTHER): Payer: Self-pay | Admitting: Licensed Clinical Social Worker

## 2015-10-30 DIAGNOSIS — F1010 Alcohol abuse, uncomplicated: Secondary | ICD-10-CM

## 2015-10-30 DIAGNOSIS — Z630 Problems in relationship with spouse or partner: Secondary | ICD-10-CM

## 2015-10-31 DIAGNOSIS — Z136 Encounter for screening for cardiovascular disorders: Secondary | ICD-10-CM
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
## 2015-10-31 NOTE — Congregational Nurse Program (Signed)
Congregational Nurse Program Note  Date of Encounter: 10/31/2015  Past Medical History: Past Medical History:  Diagnosis Date  . Gallstones     Encounter Details:     CNP Questionnaire - 10/31/15 1501      Patient Demographics   Is this a new or existing patient? New   Patient is considered a/an Not Applicable   Race Asian     Patient Assistance   Location of Patient Assistance Not Applicable   Patient's financial/insurance status Low Income   Uninsured Patient Yes   Interventions Not Applicable;Counseled to make appt. with provider   Patient referred to apply for the following financial assistance [facility]/Care Connect[facility]   Food insecurities addressed Not Applicable   Transportation assistance No   Assistance securing medications No   Educational health offerings Other     Encounter Details   Primary purpose of visit Chronic Illness/Condition Visit   Was an Emergency Department visit averted? Not Applicable   Does patient have a medical provider? No   Patient referred to Other (comment);Establish PCP  social worker   Was a mental health screening completed? (GAINS tool) No   Does patient have dental issues? Yes   Was a dental referral made? No resources for a referral   Does patient have vision issues? No   Does your patient have an abnormal blood pressure today? Yes   Since previous encounter, have you referred patient for abnormal blood pressure that resulted in a new diagnosis or medication change? No   Does your patient have an abnormal blood glucose today? No   Since previous encounter, have you referred patient for abnormal blood glucose that resulted in a new diagnosis or medication change? No   Was there a life-saving intervention made? No      [facility] for B/P re-evaluation and [employer] [facility] application. Blood Pressure borderline elevation without medications. Observed wearing head cap continuously. Describes hair as "ugly" as reason for  head covering. Very cheerful and responsive to conversation.Plan: Referred to [facility];return 08/15 with documentation.  Refer to PCP and follow-up with Endocrinologist once covered. Advised regarding limited salt intake; exercise for health; information  on hair care. Ferol LuzMarietta Rendell Thivierge, RN/CN.[phone].

## 2015-11-15 ENCOUNTER — Ambulatory Visit (INDEPENDENT_AMBULATORY_CARE_PROVIDER_SITE_OTHER): Payer: Self-pay | Admitting: Licensed Clinical Social Worker

## 2015-11-15 ENCOUNTER — Encounter: Payer: Self-pay | Admitting: Internal Medicine

## 2015-11-15 ENCOUNTER — Ambulatory Visit (INDEPENDENT_AMBULATORY_CARE_PROVIDER_SITE_OTHER): Payer: Self-pay | Admitting: Internal Medicine

## 2015-11-15 VITALS — BP 162/100 | HR 62 | Resp 16 | Ht <= 58 in | Wt 129.0 lb

## 2015-11-15 DIAGNOSIS — Z79899 Other long term (current) drug therapy: Secondary | ICD-10-CM

## 2015-11-15 DIAGNOSIS — I10 Essential (primary) hypertension: Secondary | ICD-10-CM

## 2015-11-15 DIAGNOSIS — IMO0001 Reserved for inherently not codable concepts without codable children: Secondary | ICD-10-CM

## 2015-11-15 DIAGNOSIS — Z630 Problems in relationship with spouse or partner: Secondary | ICD-10-CM

## 2015-11-15 DIAGNOSIS — F1010 Alcohol abuse, uncomplicated: Secondary | ICD-10-CM

## 2015-11-15 DIAGNOSIS — F1021 Alcohol dependence, in remission: Secondary | ICD-10-CM

## 2015-11-15 DIAGNOSIS — M654 Radial styloid tenosynovitis [de Quervain]: Secondary | ICD-10-CM

## 2015-11-15 DIAGNOSIS — R030 Elevated blood-pressure reading, without diagnosis of hypertension: Secondary | ICD-10-CM

## 2015-11-15 NOTE — Progress Notes (Signed)
Subjective:    Patient ID: Diana Velasquez, female    DOB: Nov 04, 1975, 40 y.o.   MRN: [id number]  HPI   New to establish Burmese/Chin  1.  Right radial wrist pain:  Has been a problem for 3 months.  Used to work in [employer] before this started.  Stopped working there because of this 2 months ago.   No immobilization of hand and wrist attempted.   Apparently, had similar pain on left side first and was given Ibuprofen and resolved.   Developed the same pain in right wrist, treated with Ibuprofen and resolved. When redeveloped pain in right wrist 3 months later, medication did not help. Stopped taking Ibuprofen after 3 bottles as still with pain, but only using 200 mg every 6 hours. Never had redness or swelling in the area. Does have numbness in radial wrist as well.  2.  Elevated BP:  Has been noted to be elevated for 1 yr. 2 other times this month with Congregational Nurse.   3.  Hx of over use of alcohol:  Stopped drinking about 3 weeks ago.  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  Husband sometimes drinks, but she cannot say if he drinks too much.   Meds:  Unknown BCP from [place]:  She has been using for 1 year.  No Known Allergies   Past Medical History:  Diagnosis Date  . Gallstones    Past Surgical History:  Procedure Laterality Date  . CHOLECYSTECTOMY N/A 04/28/2012   Procedure: LAPAROSCOPIC CHOLECYSTECTOMY WITH INTRAOPERATIVE CHOLANGIOGRAM;  Surgeon: Kandis Cocking, MD;  Location: [facility] OR;  Service: General;  Laterality: N/A;   Social History   Social History  . Marital status: Married    Spouse name: N/A  . Number of children: N/A  . Years of education: N/A   Occupational History  . Not on file.   Social History Main Topics  . Smoking status: Never Smoker  . Smokeless tobacco: Former Neurosurgeon    Types: Chew    Quit date: 04/19/2015  . Alcohol use No     Comment: quit 3 weeks ago  . Drug use: No  . Sexual activity: Yes    Other Topics Concern  . Not on file   Social History Narrative  . No narrative on file    Family History  Problem Relation Age of Onset  . Arthritis Father        Review of Systems     Objective:   Physical Exam  NAD HEENT: PERRL, EOMI, discs sharp, TMs pearly gray, throat without injection. Neck:  Supple, no adenpathy Chest:  CTA CV:  RRR with normal S1 and S2, No S3, S4 or murmur.  Radial and DP pulses normal and equal. Abd:  S, NT, No HSM or mass, +BS Right wrist:  + Finkelstein's test.  Tender along Hallucis tendon.  No swelling or erythema.  Negative Tinels or phalens over median nerve.  Pain with movement of thumb.  Normal palmar flexion and dorsiflexion.  NT over snuffbox  Supination and pronation without significant discomfort.      Assessment & Plan:  1. DeQuervain's Tenosynovitis.  Recommended wearing spica splint except when washing hands or bathing.  Photo of splint given.  Ibuprofen 600mg  twice daily for 2 weeks then as needed. CBC  2.  High Blood pressure:  Call into [facility] to check on BCP contents and whether BP running high with her checks.  CMP,  CBC  3.  " other issues" :  Will get more info on social issues surrounding this family with history of significant alcohol use until recently.   Referral for counseling.

## 2015-11-15 NOTE — Patient Instructions (Signed)
Ibuprofen 200 3 tabs twice daily with food for 2 weeks, then as needed. Wear splint except when bathing or washing hands Spica Splint

## 2015-11-16 LAB — CBC WITH DIFFERENTIAL/PLATELET
Basophils Absolute: 0.1 x10E3/uL (ref 0.0–0.2)
Basos: 1 %
EOS (ABSOLUTE): 0.2 x10E3/uL (ref 0.0–0.4)
Eos: 2 %
Hematocrit: 40.5 % (ref 34.0–46.6)
Hemoglobin: 13.6 g/dL (ref 11.1–15.9)
Immature Grans (Abs): 0 x10E3/uL (ref 0.0–0.1)
Immature Granulocytes: 0 %
Lymphocytes Absolute: 2 x10E3/uL (ref 0.7–3.1)
Lymphs: 22 %
MCH: 33.2 pg — ABNORMAL HIGH (ref 26.6–33.0)
MCHC: 33.6 g/dL (ref 31.5–35.7)
MCV: 99 fL — ABNORMAL HIGH (ref 79–97)
Monocytes Absolute: 0.6 x10E3/uL (ref 0.1–0.9)
Monocytes: 7 %
Neutrophils Absolute: 6 x10E3/uL (ref 1.4–7.0)
Neutrophils: 68 %
Platelets: 332 x10E3/uL (ref 150–379)
RBC: 4.1 x10E6/uL (ref 3.77–5.28)
RDW: 13 % (ref 12.3–15.4)
WBC: 8.9 x10E3/uL (ref 3.4–10.8)

## 2015-11-16 LAB — COMPREHENSIVE METABOLIC PANEL WITH GFR
ALT: 14 IU/L (ref 0–32)
AST: 23 IU/L (ref 0–40)
Albumin/Globulin Ratio: 1.2 (ref 1.2–2.2)
Albumin: 4.6 g/dL (ref 3.5–5.5)
Alkaline Phosphatase: 89 IU/L (ref 39–117)
BUN/Creatinine Ratio: 28 — ABNORMAL HIGH (ref 9–23)
BUN: 17 mg/dL (ref 6–24)
Bilirubin Total: 0.4 mg/dL (ref 0.0–1.2)
CO2: 23 mmol/L (ref 18–29)
Calcium: 9.9 mg/dL (ref 8.7–10.2)
Chloride: 97 mmol/L (ref 96–106)
Creatinine, Ser: 0.61 mg/dL (ref 0.57–1.00)
GFR calc Af Amer: 129 mL/min/1.73
GFR calc non Af Amer: 112 mL/min/1.73
Globulin, Total: 3.7 g/dL (ref 1.5–4.5)
Glucose: 97 mg/dL (ref 65–99)
Potassium: 3.9 mmol/L (ref 3.5–5.2)
Sodium: 138 mmol/L (ref 134–144)
Total Protein: 8.3 g/dL (ref 6.0–8.5)
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
## 2015-11-28 DIAGNOSIS — H579 Unspecified disorder of eye and adnexa: Secondary | ICD-10-CM

## 2015-11-28 DIAGNOSIS — Z136 Encounter for screening for cardiovascular disorders: Secondary | ICD-10-CM

## 2015-11-28 NOTE — Congregational Nurse Program (Unsigned)
Congregational Nurse Program Note  Date of Encounter: 11/28/2015  Past Medical History: Past Medical History:  Diagnosis Date  . Gallstones     Encounter Details:     CNP Questionnaire - 11/28/15 1421      Patient Demographics   Is this a new or existing patient? Existing   Patient is considered a/an Refugee   Race Asian     Patient Assistance   Location of Patient Assistance Not Applicable   Patient's financial/insurance status Low Income;[facility] Connects   Uninsured Patient Yes   Patient referred to apply for the following financial assistance Not Applicable   Food insecurities addressed Not Applicable   Transportation assistance No   Assistance securing medications No   Educational health offerings Navigating the healthcare system     Encounter Details   Primary purpose of visit Navigating the Healthcare System   Was an Emergency Department visit averted? Not Applicable   Does patient have a medical provider? Yes   Patient referred to Other (comment)   Was a mental health screening completed? (GAINS tool) No   Does patient have dental issues? Yes   Was a dental referral made? No resources for a referral   Does patient have vision issues? --  No insurance for vision problem   Does your patient have an abnormal blood pressure today? No   Since previous encounter, have you referred patient for abnormal blood pressure that resulted in a new diagnosis or medication change? No   Does your patient have an abnormal blood glucose today? No   Since previous encounter, have you referred patient for abnormal blood glucose that resulted in a new diagnosis or medication change? No   Was there a life-saving intervention made? No         Amb Nursing Assessment - 11/15/15 1619      Pre-visit preparation   Pre-visit preparation completed Yes     Pain Assessment   Pain Assessment 0-10   Pain Score 6    Pain Type Acute pain   Pain Location Wrist  and hand   Pain  Orientation Right   Pain Descriptors / Indicators Stabbing;Pressure;Aching;Sharp   Pain Onset More than a month ago   Pain Frequency Constant     Nutrition Screen   BMI - recorded 26.96   Nutritional Status BMI 25 -29 Overweight   Nutritional Risks None   Diabetes No     Functional Status   Activities of Daily Living Independent   Ambulation Independent   Medication Administration Independent   Home Management Independent     Risk/Barriers  Assessment   Barriers to Care Management & Learning Language   Psychosocial Barriers Uninsured/under-insured     Abuse/Neglect Assessment   Do you feel unsafe in your current relationship? No   Do you feel physically threatened by others? No   Anyone hurting you at home, work, or school? No   Unable to ask? No     Patient Literacy   How often do you need to have someone help you when you read instructions, pamphlets, or other written materials from your doctor or pharmacy? 1 - Never   What is the last grade level you completed in school? 2 years of school in [place]     Product manager   Interpreter Needed? Yes   Interpreter Name Earnestine Mealing OO   Patient Declined Interpreter  No   Patient signed [facility] Health waiver No     Office visit at [facility]  with concern about vision today. Unable to read closeup and distant reading materials in classroom. Wearing glasses for reading, but not helping. Vision 20/200 close up reading and 10/35 at distance.  Continues to experience pain in right wrist. Using wrist support and taking Motrin as recommended by PCP. Next PCP appointment is 12/26/15. Current medical coverage is [employer]. Refer to agency social worker, Chealy Sin for eye exam and dental resources; continue researching other options. Follow-up counseling with Deborah Chalk. Evans, MHN/CN at [facility] scheduled. Ferol LuzMarietta Leiam Hopwood, RN/CN.

## 2015-11-30 DIAGNOSIS — Z136 Encounter for screening for cardiovascular disorders: Secondary | ICD-10-CM

## 2015-12-07 DIAGNOSIS — Z136 Encounter for screening for cardiovascular disorders: Secondary | ICD-10-CM

## 2015-12-11 NOTE — Congregational Nurse Program (Signed)
Congregational Nurse Program Note  Date of Encounter: 11/30/2015  Past Medical History: Past Medical History:  Diagnosis Date  . Gallstones     Encounter Details:     CNP Questionnaire - 11/30/15 1010      Patient Demographics   Is this a new or existing patient? New   Patient is considered a/an Refugee   Race Asian     Patient Assistance   Location of Patient Assistance Not Applicable   Patient's financial/insurance status Low Income;[place] [facility]/Care Connects   Uninsured Patient Yes   Interventions Not Applicable;Counseled to make appt. with provider   Patient referred to apply for the following financial assistance Not Applicable   Food insecurities addressed Not Applicable   Transportation assistance No   Assistance securing medications No   Educational health offerings Navigating the healthcare system     Encounter Details   Primary purpose of visit Navigating the Healthcare System   Was an Emergency Department visit averted? Not Applicable   Does patient have a medical provider? Yes   Patient referred to Other (comment)   Was a mental health screening completed? (GAINS tool) No   Does patient have dental issues? Yes   Was a dental referral made? No resources for a referral   Does patient have vision issues? Yes  No insurance for vision problem   Was a vision referral made? No   Does your patient have an abnormal blood pressure today? Yes   Since previous encounter, have you referred patient for abnormal blood pressure that resulted in a new diagnosis or medication change? No   Does your patient have an abnormal blood glucose today? No   Since previous encounter, have you referred patient for abnormal blood glucose that resulted in a new diagnosis or medication change? No   Was there a life-saving intervention made? No         Amb Nursing Assessment - 11/15/15 1619      Pre-visit preparation   Pre-visit preparation completed Yes     Pain Assessment   Pain  Assessment 0-10   Pain Score 6    Pain Type Acute pain   Pain Location Wrist  and hand   Pain Orientation Right   Pain Descriptors / Indicators Stabbing;Pressure;Aching;Sharp   Pain Onset More than a month ago   Pain Frequency Constant     Nutrition Screen   BMI - recorded 26.96   Nutritional Status BMI 25 -29 Overweight   Nutritional Risks None   Diabetes No     Functional Status   Activities of Daily Living Independent   Ambulation Independent   Medication Administration Independent   Home Management Independent     Risk/Barriers  Assessment   Barriers to Care Management & Learning Language   Psychosocial Barriers Uninsured/under-insured     Abuse/Neglect Assessment   Do you feel unsafe in your current relationship? No   Do you feel physically threatened by others? No   Anyone hurting you at home, work, or school? No   Unable to ask? No     Patient Literacy   How often do you need to have someone help you when you read instructions, pamphlets, or other written materials from your doctor or pharmacy? 1 - Never   What is the last grade level you completed in school? 2 years of school in [place]     Product managerLanguage Assistant   Interpreter Needed? Yes   Interpreter Name Earnestine MealingMaung Maung OO   Patient Declined Interpreter  No   Patient signed [facility] waiver No     B/P check 148/95.  States is taking "blood pressure" medicine.  Is wearing a brace to left wrist.  States is taking Ibuprophen three times a day and that the wrist is not getting better

## 2015-12-11 NOTE — Congregational Nurse Program (Signed)
Congregational Nurse Program Note  Date of Encounter: 12/07/2015  Past Medical History: Past Medical History:  Diagnosis Date  . Gallstones     Encounter Details:     CNP Questionnaire - 12/07/15 1014      Patient Demographics   Is this a new or existing patient? New   Patient is considered a/an Refugee   Race Asian     Patient Assistance   Location of Patient Assistance Not Applicable   Patient's financial/insurance status Low Income;[place] [facility]/Care Connects   Uninsured Patient Yes   Interventions Not Applicable;Counseled to make appt. with provider   Patient referred to apply for the following financial assistance Not Applicable   Food insecurities addressed Not Applicable   Transportation assistance No   Assistance securing medications No   Educational health offerings Navigating the healthcare system     Encounter Details   Primary purpose of visit Navigating the Healthcare System   Was an Emergency Department visit averted? Not Applicable   Does patient have a medical provider? Yes   Patient referred to Other (comment)   Was a mental health screening completed? (GAINS tool) No   Does patient have dental issues? Yes   Was a dental referral made? No resources for a referral   Does patient have vision issues? Yes  No insurance for vision problem   Was a vision referral made? No   Does your patient have an abnormal blood pressure today? Yes   Since previous encounter, have you referred patient for abnormal blood pressure that resulted in a new diagnosis or medication change? No   Does your patient have an abnormal blood glucose today? No   Since previous encounter, have you referred patient for abnormal blood glucose that resulted in a new diagnosis or medication change? No   Was there a life-saving intervention made? No     B/P check  148/95. Reported results to congregational nurse Ferol LuzMarietta Douglas who is following client.

## 2015-12-24 ENCOUNTER — Emergency Department (HOSPITAL_COMMUNITY)
Admission: EM | Admit: 2015-12-24 | Discharge: 2015-12-24 | Disposition: A | Discharge status: Home / Self Care | Payer: BLUE CROSS/BLUE SHIELD | Attending: Emergency Medicine | Admitting: Emergency Medicine

## 2015-12-24 ENCOUNTER — Encounter (HOSPITAL_COMMUNITY): Payer: Self-pay

## 2015-12-24 DIAGNOSIS — X111XXA Contact with running hot water, initial encounter: Secondary | ICD-10-CM | POA: Insufficient documentation

## 2015-12-24 DIAGNOSIS — Y929 Unspecified place or not applicable: Secondary | ICD-10-CM | POA: Insufficient documentation

## 2015-12-24 DIAGNOSIS — T22112A Burn of first degree of left forearm, initial encounter: Secondary | ICD-10-CM | POA: Insufficient documentation

## 2015-12-24 DIAGNOSIS — Y999 Unspecified external cause status: Secondary | ICD-10-CM | POA: Insufficient documentation

## 2015-12-24 DIAGNOSIS — Y93G3 Activity, cooking and baking: Secondary | ICD-10-CM | POA: Insufficient documentation

## 2015-12-24 DIAGNOSIS — T22111A Burn of first degree of right forearm, initial encounter: Secondary | ICD-10-CM | POA: Insufficient documentation

## 2015-12-24 DIAGNOSIS — Z23 Encounter for immunization: Secondary | ICD-10-CM | POA: Insufficient documentation

## 2015-12-24 MED ORDER — TETANUS-DIPHTH-ACELL PERTUSSIS 5-2.5-18.5 LF-MCG/0.5 IM SUSP
0.5000 mL | Freq: Once | INTRAMUSCULAR | Status: AC
  Administered 2015-12-24: 0.5 mL via INTRAMUSCULAR
  Filled 2015-12-24: qty 0.5

## 2015-12-24 MED ORDER — SILVER SULFADIAZINE 1 % EX CREA
TOPICAL_CREAM | Freq: Once | CUTANEOUS | Status: AC
  Administered 2015-12-24: 14:00:00 via TOPICAL
  Filled 2015-12-24: qty 85

## 2015-12-24 MED ORDER — SILVER SULFADIAZINE 1 % EX CREA: 1.0000 "application " | TOPICAL_CREAM | Freq: Two times a day (BID) | CUTANEOUS | 0 refills | Status: DC

## 2015-12-24 NOTE — ED Notes (Signed)
Declined W/C at D/C and was escorted to lobby by RN. 
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
## 2015-12-25 ENCOUNTER — Encounter: Payer: Self-pay | Admitting: Internal Medicine

## 2015-12-26 ENCOUNTER — Ambulatory Visit: Payer: Self-pay | Admitting: Internal Medicine

## 2015-12-29 ENCOUNTER — Encounter (HOSPITAL_COMMUNITY): Payer: Self-pay | Admitting: Emergency Medicine

## 2015-12-29 ENCOUNTER — Ambulatory Visit (HOSPITAL_COMMUNITY)
Admission: EM | Admit: 2015-12-29 | Discharge: 2015-12-29 | Disposition: A | Discharge status: Home / Self Care | Payer: BLUE CROSS/BLUE SHIELD | Attending: Emergency Medicine | Admitting: Emergency Medicine

## 2015-12-29 DIAGNOSIS — T300 Burn of unspecified body region, unspecified degree: Secondary | ICD-10-CM | POA: Diagnosis not present

## 2015-12-29 DIAGNOSIS — R030 Elevated blood-pressure reading, without diagnosis of hypertension: Secondary | ICD-10-CM

## 2015-12-29 DIAGNOSIS — Z5189 Encounter for other specified aftercare: Secondary | ICD-10-CM

## 2015-12-29 MED ORDER — SILVER SULFADIAZINE 1 % EX CREA
TOPICAL_CREAM | Freq: Once | CUTANEOUS | Status: AC
  Administered 2015-12-29: 12:00:00 via TOPICAL

## 2015-12-29 MED ORDER — ACETAMINOPHEN 325 MG PO TABS
975.0000 mg | ORAL_TABLET | Freq: Once | ORAL | Status: AC
  Administered 2015-12-29: 975 mg via ORAL

## 2015-12-29 MED ORDER — ACETAMINOPHEN 325 MG PO TABS
ORAL_TABLET | ORAL | Status: AC
  Filled 2015-12-29: qty 3
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
## 2015-12-29 NOTE — Discharge Instructions (Addendum)
Daily wound changes with silvadene. Mederma after wounds heal ~ 6 months, avoid sun. Return toUC as needed

## 2015-12-29 NOTE — ED Notes (Signed)
Applied Silvadene Ointment, dressed with nonadherent pad and wrapped with gauze and co-band
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
## 2016-01-09 ENCOUNTER — Other Ambulatory Visit (INDEPENDENT_AMBULATORY_CARE_PROVIDER_SITE_OTHER): Payer: Self-pay | Admitting: Licensed Clinical Social Worker

## 2016-01-09 DIAGNOSIS — Z630 Problems in relationship with spouse or partner: Secondary | ICD-10-CM

## 2016-01-10 ENCOUNTER — Telehealth: Payer: Self-pay | Admitting: Licensed Clinical Social Worker
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
## 2016-02-14 ENCOUNTER — Ambulatory Visit: Payer: Self-pay | Admitting: Internal Medicine

## 2016-02-19 ENCOUNTER — Encounter: Payer: Self-pay | Admitting: Internal Medicine

## 2016-02-19 ENCOUNTER — Ambulatory Visit (INDEPENDENT_AMBULATORY_CARE_PROVIDER_SITE_OTHER): Payer: Self-pay | Admitting: Internal Medicine

## 2016-02-19 VITALS — BP 128/84 | HR 78 | Resp 12 | Ht <= 58 in | Wt 133.0 lb

## 2016-02-19 DIAGNOSIS — F1010 Alcohol abuse, uncomplicated: Secondary | ICD-10-CM

## 2016-02-19 DIAGNOSIS — R030 Elevated blood-pressure reading, without diagnosis of hypertension: Secondary | ICD-10-CM

## 2016-02-19 DIAGNOSIS — M654 Radial styloid tenosynovitis [de Quervain]: Secondary | ICD-10-CM | POA: Insufficient documentation

## 2016-02-19 MED ORDER — IBUPROFEN 200 MG PO TABS: ORAL_TABLET | ORAL | 0 refills | Status: DC

## 2016-02-19 NOTE — Progress Notes (Signed)
   Subjective:    Patient ID: Diana Velasquez, female    DOB: [birth year], 40 y.o.   MRN: [id number]  HPI   1.  Right DeQuervain's Tenosynovitis:  Wearing a regular cock up splint, not a spica splint.  States generally does wear a spica splint, but needed to wash today.  Does this maybe once weekly.  Does not feel it has made a difference.  Ibuprofen helped a bit, but ran out.  More pain than before. Ibuprofen helped, but only took for 2 weeks, then stopped  2.  Elevated BP:  Fine today--spurious check last visit.  3.  Alcohol abuse:  States sober since here at end of August.   Almost 3 months.   Not counseling with Samul DadaN. Knight, LCSW recently:  States she has not called back. Going to AA once weekly, but doesn't understand what is being said--more out of sessions previously with N. KNight.  No outpatient prescriptions have been marked as taking for the 02/19/16 encounter (Office Visit) with Julieanne MansonElizabeth Joli Koob, MD.   No Known Allergies    Review of Systems     Objective:   Physical Exam  NAD Alert, happy Lungs:  CTA CV:  RRR without murmur or rub, radial pulses normal and equal Right wrist:  Wearing a cock up splint. + Finkelstein's test.  Tender over hallucis longus tendon      Assessment & Plan:  1.  Tenosynovitis:  [facility].  Given paperwork for Financial Assistance. To stay away from cock up splint and use spica splint.  Reportedly, need to direct calls to a Victorino DikeJennifer, but not clear who this is or what her contact info is.  2.  Elevated BP:  Fine today--likely due to alcohol abuse and currently not drinking.  3.  Alcohol Abuse:  Applauded staying away from alcohol, but am concerned she is not getting enough from AA.  Will ask Samul DadaN. Knight, LCSW to try to get back in touch with her.  Call into Maren ReamerMaureen Flak, Congregational nurse to get more information about who is taking her to meetings and whether any other way for her to have interpreter there or if there is a way to  get the community involved in their own AA meeting in a confidential way (understanding the concept that no information discussed in the meeting is discussed outside of the meeting.)

## 2016-02-26 ENCOUNTER — Other Ambulatory Visit (INDEPENDENT_AMBULATORY_CARE_PROVIDER_SITE_OTHER): Payer: Self-pay | Admitting: Licensed Clinical Social Worker

## 2016-02-26 DIAGNOSIS — Z630 Problems in relationship with spouse or partner: Secondary | ICD-10-CM

## 2016-02-26 DIAGNOSIS — F1010 Alcohol abuse, uncomplicated: Secondary | ICD-10-CM
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
## 2016-03-25 ENCOUNTER — Other Ambulatory Visit (INDEPENDENT_AMBULATORY_CARE_PROVIDER_SITE_OTHER): Payer: Self-pay | Admitting: Licensed Clinical Social Worker

## 2016-03-25 DIAGNOSIS — Z630 Problems in relationship with spouse or partner: Secondary | ICD-10-CM
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
## 2016-04-08 ENCOUNTER — Other Ambulatory Visit (INDEPENDENT_AMBULATORY_CARE_PROVIDER_SITE_OTHER): Payer: Self-pay | Admitting: Licensed Clinical Social Worker

## 2016-04-08 DIAGNOSIS — Z630 Problems in relationship with spouse or partner: Secondary | ICD-10-CM

## 2016-04-09 NOTE — Progress Notes (Signed)
   THERAPY PROGRESS NOTE  Session Time: 30min  Participation Level: Active  Behavioral Response: CasualAlertEuthymic  Type of Therapy: Individual Therapy  Treatment Goals addressed: Coping  Interventions: Supportive  Summary: Diana Velasquez is a 41 y.o. female who presents with a positive mood and appropriate affect. She reported that her cold/flu symptoms were much improved. She shared that her arm pain has continued to get worse and is keeping her from being able to work. [name] stated that she had a lot to talk about with LCSW, but that she could not talk much today because her oldest son was upstairs. She requested a brief chat and then rescheduled to come to LCSW office. She shared about how the family reacted to the recent snow.   Suicidal/Homicidal: Nowithout intent/plan  Therapist Response: LCSW utilized supportive counseling techniques throughout the session in order to validate emotions and encourage open expression of emotion.  Plan: Return again in 1 weeks.  Diagnosis: Axis I: See current hospital problem list    Axis II: No diagnosis    Nilda Simmeratosha Edelin Fryer, LCSW 04/09/2016

## 2016-04-15 ENCOUNTER — Other Ambulatory Visit (INDEPENDENT_AMBULATORY_CARE_PROVIDER_SITE_OTHER): Payer: Self-pay | Admitting: Licensed Clinical Social Worker

## 2016-04-15 DIAGNOSIS — Z630 Problems in relationship with spouse or partner: Secondary | ICD-10-CM
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
## 2016-04-29 ENCOUNTER — Other Ambulatory Visit (INDEPENDENT_AMBULATORY_CARE_PROVIDER_SITE_OTHER): Payer: Self-pay | Admitting: Licensed Clinical Social Worker

## 2016-04-29 DIAGNOSIS — F1021 Alcohol dependence, in remission: Secondary | ICD-10-CM

## 2016-04-29 DIAGNOSIS — Z630 Problems in relationship with spouse or partner: Secondary | ICD-10-CM
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
## 2016-05-13 ENCOUNTER — Other Ambulatory Visit: Payer: Self-pay | Admitting: Licensed Clinical Social Worker

## 2016-05-20 ENCOUNTER — Ambulatory Visit (INDEPENDENT_AMBULATORY_CARE_PROVIDER_SITE_OTHER): Payer: Self-pay | Admitting: Internal Medicine

## 2016-05-20 ENCOUNTER — Encounter: Payer: Self-pay | Admitting: Internal Medicine

## 2016-05-20 VITALS — BP 122/80 | HR 68 | Resp 12 | Ht <= 58 in | Wt 137.0 lb

## 2016-05-20 DIAGNOSIS — M654 Radial styloid tenosynovitis [de Quervain]: Secondary | ICD-10-CM

## 2016-05-20 MED ORDER — PREDNISONE 10 MG PO TABS: ORAL_TABLET | ORAL | 0 refills | Status: AC

## 2016-05-20 NOTE — Patient Instructions (Addendum)
Prednisone 10 mg tabs:  Day 1:  4 tabs Day 2:  4 tabs Day 3:  4 tabs Day 4 : 4 tabs Day 5:  4 tabs   Day 6:  3 1/2 tabs   Day 7:  3 tabs Day 8:  2 1/2 tabs Day 9:  2 tabs Day10:  1 1/2 tabs Day11:  1 tab Day 12:  1/2 tab--then stop

## 2016-05-20 NOTE — Progress Notes (Addendum)
   Subjective:    Patient ID: Diana Velasquez, female    DOB: 11-Dec-1975, 41 y.o.   MRN: [id number]  HPI   1.  Right DeQuervain's Tenosynovitis:  Apparently never heard from [facility] Financial [facility].  Still taking Ibuprofen--doesn't feel it is helping.  Her entire arm is starting to hurt. States since December after last seen.  Not clear if wearing the short spica splint helps with the pain during the time she is wearing it.   2. Alcohol abuse:  No use for 3.5 months.    3.  Immigration issues:  She came as a refugee through [place] and apparently all people who came here through [place] are asked to appear before [employer] and fill out a long questionnaire.   They apparently are already working with Erie NoeElon Law on this matter.  Current Meds  Medication Sig  . [DISCONTINUED] ibuprofen (ADVIL,MOTRIN) 200 MG tablet 3 tabs by mouth twice daily with food   No Known Allergies    Review of Systems     Objective:   Physical Exam  Pain with Finkelstein's  on right .  Tender all along hallucis tendons.  Not clear if tender in snuffbox or more so the hallucis tendon.        Assessment & Plan:  Dequervain's Tenosynoviitis:  Prednisone burst and taper.  40 mg for 5 days, then taper. Diana Velasquez, Diana Velasquez here with her today and will make sure she is set up. Possible appt. Finally with [facility] ortho this week.  If so, will not refer to [facility].  [facility] Diana Velasquez contacts:   Diana Velasquez [phone] [email or web address]  Diana Velasquez  [phone] [email or web address]
